# Patient Record
Sex: Female | Born: 1960 | Race: White | Hispanic: No | Marital: Married | Smoking: Never smoker
Health system: Southern US, Community
[De-identification: ages and names within clinical notes are randomized; demographics above are authoritative.]

## PROBLEM LIST (undated history)

## (undated) DIAGNOSIS — F32A Depression, unspecified: Secondary | ICD-10-CM

## (undated) DIAGNOSIS — K219 Gastro-esophageal reflux disease without esophagitis: Secondary | ICD-10-CM

## (undated) DIAGNOSIS — F329 Major depressive disorder, single episode, unspecified: Secondary | ICD-10-CM

## (undated) DIAGNOSIS — N201 Calculus of ureter: Secondary | ICD-10-CM

## (undated) DIAGNOSIS — Z973 Presence of spectacles and contact lenses: Secondary | ICD-10-CM

## (undated) DIAGNOSIS — E039 Hypothyroidism, unspecified: Secondary | ICD-10-CM

## (undated) DIAGNOSIS — Z87442 Personal history of urinary calculi: Secondary | ICD-10-CM

## (undated) DIAGNOSIS — E079 Disorder of thyroid, unspecified: Secondary | ICD-10-CM

## (undated) HISTORY — PX: BREAST BIOPSY: SHX20

## (undated) HISTORY — PX: BREAST EXCISIONAL BIOPSY: SUR124

---

## 1998-08-03 HISTORY — PX: BREAST EXCISIONAL BIOPSY: SUR124

## 2001-11-15 ENCOUNTER — Ambulatory Visit (HOSPITAL_COMMUNITY): Admission: RE | Admit: 2001-11-15 | Discharge: 2001-11-15 | Payer: Self-pay | Admitting: *Deleted

## 2001-11-15 ENCOUNTER — Encounter: Payer: Self-pay | Admitting: *Deleted

## 2001-11-22 ENCOUNTER — Encounter: Payer: Self-pay | Admitting: *Deleted

## 2001-11-22 ENCOUNTER — Ambulatory Visit (HOSPITAL_COMMUNITY): Admission: RE | Admit: 2001-11-22 | Discharge: 2001-11-22 | Payer: Self-pay | Admitting: *Deleted

## 2001-12-21 ENCOUNTER — Ambulatory Visit (HOSPITAL_COMMUNITY): Admission: RE | Admit: 2001-12-21 | Discharge: 2001-12-21 | Payer: Self-pay | Admitting: Obstetrics and Gynecology

## 2001-12-21 ENCOUNTER — Encounter: Payer: Self-pay | Admitting: Obstetrics and Gynecology

## 2002-04-21 ENCOUNTER — Inpatient Hospital Stay (HOSPITAL_COMMUNITY): Admission: AD | Admit: 2002-04-21 | Discharge: 2002-04-24 | Payer: Self-pay | Admitting: Obstetrics and Gynecology

## 2002-04-21 ENCOUNTER — Encounter (INDEPENDENT_AMBULATORY_CARE_PROVIDER_SITE_OTHER): Payer: Self-pay | Admitting: Specialist

## 2003-01-19 ENCOUNTER — Ambulatory Visit (HOSPITAL_COMMUNITY): Admission: RE | Admit: 2003-01-19 | Discharge: 2003-01-19 | Payer: Self-pay | Admitting: Internal Medicine

## 2008-04-26 ENCOUNTER — Ambulatory Visit (HOSPITAL_COMMUNITY): Admission: RE | Admit: 2008-04-26 | Discharge: 2008-04-26 | Payer: Self-pay | Admitting: Urology

## 2010-08-03 DIAGNOSIS — Z8719 Personal history of other diseases of the digestive system: Secondary | ICD-10-CM

## 2010-08-03 HISTORY — DX: Personal history of other diseases of the digestive system: Z87.19

## 2010-12-19 NOTE — Op Note (Signed)
NAMETAJANAY, Caitlyn [name]                         ACCOUNT NO.:  [id number]   MEDICAL RECORD NO.:  [id number]                   PATIENT TYPE:  INP   LOCATION:  9198                                 FACILITY:  [facility]   PHYSICIAN:  Malachi Pro. Ambrose Mantle, M.D.              DATE OF BIRTH:  06-07-1961   DATE OF PROCEDURE:  04/21/2002  DATE OF DISCHARGE:                                 OPERATIVE REPORT   PREOPERATIVE DIAGNOSES:  1. Intrauterine pregnancy at 38 weeks and 5 days.  2. Two previous cesarean sections.  3. Voluntary sterilization.   POSTOPERATIVE DIAGNOSES:  1. Intrauterine pregnancy at 38 weeks and 5 days.  2. Two previous cesarean sections.  3. Voluntary sterilization.   OPERATIONS:  1. Low transverse cervical cesarean section.  2. Bilateral tubal ligation.   SURGEON:  Malachi Pro. Ambrose Mantle, M.D.   ASSISTANT:  Zenaida Niece, M.D.   ANESTHESIA:  Spinal.   DESCRIPTION OF PROCEDURE:  The patient was brought to the operating room and  given a spinal anesthetic by Belva Agee, M.D.  She was placed in the  left lateral tilt position.  The abdomen was prepped with Betadine solution.  A Foley catheter was inserted to straight drain after the urethra was  prepped with Betadine solution.  Fetal heart tones had been confirmed to be  normal prior to the prepping of the abdomen.  The abdomen was draped as a  sterile field.  A transverse incision was made above the two previous  transverse incisions and carried in layers through the skin and subcutaneous  tissue and down to the fascia.  The fascia was incised transversely,  separated from the rectus muscles superiorly and inferiorly, and then the  rectus muscle was already somewhat split but opened more and the peritoneum  was identified.  The peritoneum was entered.  The incision was enlarged.  The lower uterine segment was exposed.  The peritoneum over the lower  uterine segment was incised and extended laterally and then the bladder  was  pulled inferiorly.  An incision was  then made into the lower uterine  segment.  An amniotomy was done.  The incision was extended laterally.  The  vertex was under the incision.  With fundal pressure we delivered the  vertex.  The nose and pharynx were suctioned with the bulb.  The remainder  of the baby was delivered.  The cord was clamped and the infant was given to  Dr. Dorene Grebe who was in attendance.  Apgars were 7 and 9 at one and five  minutes.  It was a female infant, 8 pounds 1 ounce.  The placenta was  removed.  The cervix was dilated.  The uterus was closed with one layer  using a running suture of 0 Vicryl locking suture.  Several other figure-of-  eight sutures were required for complete hemostasis.  Both tubes and ovaries  appeared  normal.  There was window created in the mesosalpinx bilaterally  with the electrical current.  Two ties of 0 plain catgut were placed  proximally and distally over a portion of the tube and then the intervening  portion of tube was excised.  No bleeding occurred.  Reinspection of the  uterine incision revealed a couple of areas of bleeding that were controlled  with the Bovie and with suture ligature.  At this point, the incision  appeared completely dry.  Liberal irrigation confirmed hemostasis.  We tried  to remove all debris from the abdominal cavity then closed the rectus muscle  with interrupted sutures of 0 Vicryl.  Either the parietal nor the visceral  were reapproximated.  The fascia was closed with two running sutures of 0  Vicryl, subcutaneous with a running 3-0 Vicryl, and the skin was closed with  automatic staples.  The patient seemed to tolerate the procedure well.  Estimated blood loss was about 1000 cc.  Sponge and needle counts were  correct and she was returned to recovery in satisfactory condition.                                                Malachi Pro. Ambrose Mantle, M.D.    TFH/MEDQ  D:  04/21/2002  T:  04/22/2002  Job:   [id number]

## 2010-12-19 NOTE — H&P (Signed)
[name], [name]                         ACCOUNT NO.:  [id number]   MEDICAL RECORD NO.:  [id number]                   PATIENT TYPE:  INP   LOCATION:  NA                                   [facility]  [facility]   PHYSICIAN:  Malachi Pro. Ambrose Mantle, M.D.              DATE OF BIRTH:  30-Aug-1960   DATE OF ADMISSION:  04/21/2002  DATE OF DISCHARGE:                                HISTORY & PHYSICAL   PRESENT ILLNESS:  A 50 year old white married female para 2-0-0-2 gravida 3;  last menstrual period July 22, 2002; [facility] April 29, 2002 by dates and  April 30, 2002 by ultrasound; admitted for repeat C section after two  previous C sections.  Blood group and type O positive with a negative  antibody.  Nonreactive serology.  Rubella equivocal.  Hepatitis B surface  antigen negative.  HIV declined.  GC and chlamydia negative.  One-hour  Glucola 92.  Group B strep negative.  Cystic fibrosis screen negative.  Triple screen results not available at this time.  The patient had a vaginal  ultrasound on September 23, 2001:  Crown-rump length 2.09 cm, eight weeks  five days, [facility] April 30, 2002.  She had a follow-up ultrasound on November 15, 2001 that showed a complete placenta previa and also an echogenic  intracardiac focus in the left ventricle.  She saw Dr. Gavin Potters in  consultation on November 16, 2001 considering amniocentesis, but did not have  the amnio.  A follow-up ultrasound on Dec 21, 2001 showed resolution of the  placenta previa.  The patient has had a relatively benign prenatal course.  She has been bothered by itching; bile acids were normal.  She is admitted  now for repeat C section.   ALLERGIES:  PENCILLIN causes rash and itching.   PAST MEDICAL HISTORY:  She has had a history of depression, treated with  Prozac 20 mg q.d.   OPERATIONS:  C sections in 1985 and 1992.  The babies weighed 9 pounds 12  ounces and 11 pounds.  Both were done without complications.  In 2001 she  had a  left breast biopsy.   FAMILY HISTORY:  Father with heart disease and myocardial infarction,  chronic hypertension.  Maternal grandmother had melanoma.  Father had stroke  and degenerative disease of the spine with arthritis.  Maternal uncle had  diabetes.   ALCOHOL, TOBACCO, AND DRUGS:  None.   OBSTETRICAL/GYNECOLOGICAL HISTORY:  In April 1985 and October 1992 the  patient delivered 9 pound 12 ounce and 11 pound males, respectively, by C  section.  She did have a LEEP procedure in 1995.  Pap smears have been  normal since that time.   PHYSICAL EXAMINATION:  GENERAL:  Well-developed, well-nourished white female  in no acute distress.  VITAL SIGNS:  Blood pressure 132/76, pulse 80, weight 196.5 pounds which  represents a 30-pound weight gain during the pregnancy.  HEENT:  Normal.  NECK:  Supple without thyromegaly.  HEART:  Normal size and sounds, no murmurs.  LUNGS:  Clear to P&A.  BREASTS:  Not examined on admission.  ABDOMEN:  Soft.  Fundal height 43 cm.  Fetal heart tones normal.  PELVIC:  Cervix closed.  Presenting part high, but by abdominal exam  presenting part is vertex.    ADMITTING IMPRESSION:  1. Intrauterine pregnancy at 38 weeks five days.  2. Prior cesarean section x2.  3. History of macrosomic infants.   PLAN:  The patient is admitted for C section.  She wants tubal ligation; she  will be asked about this again at the time of the surgery.  She understands  the procedure to be done and is ready to proceed.                                               Malachi Pro. Ambrose Mantle, M.D.    TFH/MEDQ  D:  04/20/2002  T:  04/21/2002  Job:  [id number]

## 2010-12-19 NOTE — Op Note (Signed)
NAME:  Caitlyn [name], Caitlyn [name]                         ACCOUNT NO.:  [id number]   MEDICAL RECORD NO.:  [id number]                   PATIENT TYPE:  AMB   LOCATION:  DAY                                  FACILITY:  [facility]   PHYSICIAN:  Lionel December, M.D.                 DATE OF BIRTH:  10-05-1960   DATE OF PROCEDURE:  DATE OF DISCHARGE:                                 OPERATIVE REPORT   PROCEDURE:  Esophagogastroduodenoscopy.   ENDOSCOPIST:  Lionel December, M.D.   INDICATIONS:  Peter is a 50 year old Caucasian female who has epigastric  pain and not responding well to Aciphex.  She was recently seen by [place] for  mildly elevated transaminases felt to be due to steatohepatitis.  Workup has  been negative.  Ultrasound was repeated last week and it is negative for  cholelithiasis, but this time it does show a fatty change in the liver.  She  is undergoing diagnostic EGD.  The procedure and risks were reviewed with  the patient and informed consent was obtained.   PREOPERATIVE MEDICATIONS:  Cetacaine spray for oropharyngeal topical  anesthesia, Demerol 50 mg IV and Versed 5 mg IV in divided dose.   INSTRUMENT:  Olympus video system.   FINDINGS:  Procedure performed in endoscopy suite.  The patient's vital  signs and O2 saturation were monitored during the procedure and remained  stable.  The patient was placed in the left lateral recumbent position and  endoscope was passed via the oropharynx without any difficulty into the  esophagus.   ESOPHAGUS:  Mucosa of the esophagus was normal.  Squamocolumnar junction was  wavy or serrated with 1 short tongue of gastric-type mucosa but not enough  to be labeled Barrett's.  She had erythema at the GE junction on the gastric  side with edema.  A picture was taken for the record.  There was a small  sliding hiatal hernia no more than 3 cm in length.   STOMACH:  It was empty and distended very well with insufflation.  The folds  of the proximal stomach  were normal.  Examination of the mucosa revealed  many streaks of erythematous mucosa at antrum and prepyloric erosion with  mucosal edema.  Pyloric channel was patent.  Angularis, fundus, and cardia  were normal.   DUODENUM:  Examination of the bulb revealed normal mucosa.  The scope was  passed into the second part of the duodenum where mucosa and folds were  normal.   Endoscope was withdrawn.  The patient tolerated the procedure well.   FINAL DIAGNOSES:  1. Mild changes of reflux esophagitis limited to the gastroesophageal     junction along with a small sliding hiatal hernia.  2. Erosive antral gastritis.   I suspect her epigastric pain may be due to GERD with atypical presentation.   RECOMMENDATIONS:  1. She will continue antireflux measures and Aciphex at present dose.  Prescription given for 1 month at 5 refills.  2. H. pylori serology will be checked today.                                               Lionel December, M.D.    NR/MEDQ  D:  01/19/2003  T:  01/19/2003  Job:  [id number]   cc:   Wyvonnia Lora  [address]  [place]  [place] [address]  Fax: [phone]

## 2010-12-19 NOTE — Discharge Summary (Signed)
Caitlyn [name], LAHMAN                         ACCOUNT NO.:  [id number]   MEDICAL RECORD NO.:  [id number]                   PATIENT TYPE:  INP   LOCATION:  9120                                 FACILITY:  [facility]   PHYSICIAN:  Malachi Pro. Ambrose Mantle, M.D.              DATE OF BIRTH:  [birth year]   DATE OF ADMISSION:  04/21/2002  DATE OF DISCHARGE:  04/24/2002                                 DISCHARGE SUMMARY   HISTORY:  This is a 50 year old white female with two previous C-sections  admitted for repeat C-section and tubal ligation.  Blood group and type O  positive with a negative antibody. RPR nonreactive. Rubella equivocal.  Hepatitis B surface antigen negative. HIV declined.  GC and Chlamydia  negative. One hour Glucola 92. Group B streptococcus negative.  The patient  underwent a low transverse cervical C-section and bilateral tubal ligation  by Dr. Ambrose Mantle with Dr. Jackelyn Knife assisting under spinal anesthesia with  delivery of a female infant 8 pounds 1 ounce, Apgars of 7 at 1 and 9 at 5  minutes.  Postpartum the patient did quite well.  She had a good return of  bowel and urinary function.  She is passing flatus freely.  Had not had a  bowel movement.  Her abdomen was soft and nontender.  Staples removed.  Strips were applied and she was ready for discharge on the third postop day.   LABORATORY DATA:  Initial hemoglobin 9.7.  Hematocrit 29.5.  White count  10,600.  Platelet count 230,000.  Follow up hemoglobin 9.1.  Hematocrit  27.3.  Comprehensive metabolic profile on admission was normal except for an  AST of 70 and ALT of 47. These were repeated on the 20th of September and  there was 76 AST and 44 ALT.  The patient is presently undergoing a  hepatitis panel.  The hepatitis B surface antigen was negative.  The  hepatitis core antibody to the hepatitic C is negative.  The antibody  HAB/IgM and hepatitic B core IgM are pending.  Urinalysis was negative. RPR  nonreactive.  Rubella was  repeated the hospital and it was 12.3. Immune is  greater than 10.  \   FINAL DIAGNOSES:  1. Intrauterine pregnancy at 39 weeks.  2. See prior C-sections.  3. Voluntary sterilization.   OPERATION:  1. Low transverse cervical C-section.  2. Bilateral tubal ligation.   FINAL CONDITION:  Improved.   INSTRUCTIONS:  Instructions include regular discharge instruction booklet.  Percocet 5/325 20 tablets one every four to six hours as needed for pain.  She is to return to the office in 10 to 14 days for follow up examination.  At that time we will go over the hepatitis profile and if regardless of the  profile results we will notify her medical doctor.  She is to continue her  Prozac as taken prior to delivery.  Malachi Pro. Ambrose Mantle, M.D.    TFH/MEDQ  D:  04/24/2002  T:  04/25/2002  Job:  [id number]

## 2013-02-18 ENCOUNTER — Emergency Department (HOSPITAL_COMMUNITY)
Admission: EM | Admit: 2013-02-18 | Discharge: 2013-02-18 | Disposition: A | Discharge status: Home / Self Care | Payer: BC Managed Care – PPO | Attending: Emergency Medicine | Admitting: Emergency Medicine

## 2013-02-18 ENCOUNTER — Encounter (HOSPITAL_COMMUNITY): Payer: Self-pay | Admitting: *Deleted

## 2013-02-18 ENCOUNTER — Emergency Department (HOSPITAL_COMMUNITY): Payer: BC Managed Care – PPO

## 2013-02-18 DIAGNOSIS — M79609 Pain in unspecified limb: Secondary | ICD-10-CM | POA: Insufficient documentation

## 2013-02-18 DIAGNOSIS — Z8719 Personal history of other diseases of the digestive system: Secondary | ICD-10-CM | POA: Insufficient documentation

## 2013-02-18 DIAGNOSIS — R071 Chest pain on breathing: Secondary | ICD-10-CM | POA: Insufficient documentation

## 2013-02-18 DIAGNOSIS — E079 Disorder of thyroid, unspecified: Secondary | ICD-10-CM | POA: Insufficient documentation

## 2013-02-18 DIAGNOSIS — F3289 Other specified depressive episodes: Secondary | ICD-10-CM | POA: Insufficient documentation

## 2013-02-18 DIAGNOSIS — R0789 Other chest pain: Secondary | ICD-10-CM

## 2013-02-18 DIAGNOSIS — Z79899 Other long term (current) drug therapy: Secondary | ICD-10-CM | POA: Insufficient documentation

## 2013-02-18 DIAGNOSIS — Z880 Allergy status to penicillin: Secondary | ICD-10-CM | POA: Insufficient documentation

## 2013-02-18 DIAGNOSIS — M25519 Pain in unspecified shoulder: Secondary | ICD-10-CM | POA: Insufficient documentation

## 2013-02-18 DIAGNOSIS — F329 Major depressive disorder, single episode, unspecified: Secondary | ICD-10-CM | POA: Insufficient documentation

## 2013-02-18 HISTORY — DX: Major depressive disorder, single episode, unspecified: F32.9

## 2013-02-18 HISTORY — DX: Disorder of thyroid, unspecified: E07.9

## 2013-02-18 HISTORY — DX: Gastro-esophageal reflux disease without esophagitis: K21.9

## 2013-02-18 HISTORY — DX: Depression, unspecified: F32.A

## 2013-02-18 LAB — LIPASE, BLOOD: Lipase: 49 U/L (ref 11–59)

## 2013-02-18 LAB — CBC WITH DIFFERENTIAL/PLATELET
Eosinophils Absolute: 0 10*3/uL (ref 0.0–0.7)
Eosinophils Relative: 0 % (ref 0–5)
HCT: 40.1 % (ref 36.0–46.0)
Lymphocytes Relative: 27 % (ref 12–46)
Lymphs Abs: 1.4 10*3/uL (ref 0.7–4.0)
MCH: 29.2 pg (ref 26.0–34.0)
MCV: 85.5 fL (ref 78.0–100.0)
Monocytes Absolute: 0.6 10*3/uL (ref 0.1–1.0)
Monocytes Relative: 11 % (ref 3–12)
RBC: 4.69 MIL/uL (ref 3.87–5.11)
WBC: 5.3 10*3/uL (ref 4.0–10.5)

## 2013-02-18 LAB — BASIC METABOLIC PANEL WITH GFR
BUN: 13 mg/dL (ref 6–23)
CO2: 28 meq/L (ref 19–32)
Calcium: 10 mg/dL (ref 8.4–10.5)
Chloride: 102 meq/L (ref 96–112)
Creatinine, Ser: 0.84 mg/dL (ref 0.50–1.10)
GFR calc Af Amer: >90 mL/min
GFR calc non Af Amer: 80 mL/min — ABNORMAL LOW
Glucose, Bld: 113 mg/dL — ABNORMAL HIGH (ref 70–99)
Potassium: 3.8 meq/L (ref 3.5–5.1)
Sodium: 140 meq/L (ref 135–145)

## 2013-02-18 LAB — HEPATIC FUNCTION PANEL
ALT: 48 U/L — ABNORMAL HIGH (ref 0–35)
AST: 46 U/L — ABNORMAL HIGH (ref 0–37)
Albumin: 4.2 g/dL (ref 3.5–5.2)
Alkaline Phosphatase: 64 U/L (ref 39–117)
Bilirubin, Direct: 0.2 mg/dL (ref 0.0–0.3)
Indirect Bilirubin: 0.4 mg/dL (ref 0.3–0.9)
Total Bilirubin: 0.6 mg/dL (ref 0.3–1.2)
Total Protein: 7.3 g/dL (ref 6.0–8.3)

## 2013-02-18 MED ORDER — PANTOPRAZOLE SODIUM 40 MG IV SOLR
40.0000 mg | Freq: Once | INTRAVENOUS | Status: AC
  Administered 2013-02-18: 40 mg via INTRAVENOUS
  Filled 2013-02-18: qty 40

## 2013-02-18 MED ORDER — HYDROCODONE-ACETAMINOPHEN 5-325 MG PO TABS: ORAL_TABLET | ORAL | Status: DC

## 2013-02-18 MED ORDER — MORPHINE SULFATE 4 MG/ML IJ SOLN
4.0000 mg | INTRAMUSCULAR | Status: DC | PRN
  Administered 2013-02-18: 4 mg via INTRAVENOUS
  Filled 2013-02-18: qty 1

## 2013-02-18 MED ORDER — FAMOTIDINE IN NACL 20-0.9 MG/50ML-% IV SOLN
20.0000 mg | Freq: Once | INTRAVENOUS | Status: AC
  Administered 2013-02-18: 20 mg via INTRAVENOUS
  Filled 2013-02-18: qty 50

## 2013-02-18 MED ORDER — DIAZEPAM 5 MG PO TABS: 5.0000 mg | ORAL_TABLET | Freq: Two times a day (BID) | ORAL | Status: DC | PRN

## 2013-02-18 MED ORDER — GI COCKTAIL ~~LOC~~
30.0000 mL | Freq: Once | ORAL | Status: AC
  Administered 2013-02-18: 30 mL via ORAL
  Filled 2013-02-18: qty 30

## 2013-02-18 MED ORDER — DIAZEPAM 5 MG PO TABS
5.0000 mg | ORAL_TABLET | Freq: Once | ORAL | Status: AC
  Administered 2013-02-18: 5 mg via ORAL
  Filled 2013-02-18: qty 1

## 2013-02-18 NOTE — ED Provider Notes (Signed)
History    CSN: [id number] Arrival date & time 02/18/13  1716  First MD Initiated Contact with Patient 02/18/13 1751     Chief Complaint  Patient presents with  . Chest Pain  . Shoulder Pain  . Arm Pain  . Nausea    HPI Pt was seen at 1810. Per pt and her family, c/o gradual onset and persistence of constant mid-sternal chest "pain" since last night approx 2200. Pt states the discomfort has been constant since that time. Pt describes the discomfort as "dull" and "pressure," worse when she takes a deep breath, radiates into her mid-back.  Pt states she experienced the same symptoms 5 days ago that lasted for 1 full day before resolving after taking OTC zantac. Pt states she was evaluated by [facility], [facility], [facility], given a GI cocktail and a SL ntg with transient relief. Pt came to the ED for further eval. Denies palpitations, no SOB/cough, no abd pain, no N/V/D, no fevers, no rash.    Past Medical History  Diagnosis Date  . Depression   . Thyroid disease   . GERD (gastroesophageal reflux disease)    Past Surgical History  Procedure Laterality Date  . Cesarean section    . Breast biopsy      FHx: father with MI in his 35's.  History  Substance Use Topics  . Smoking status: Never Smoker   . Smokeless tobacco: Not on file  . Alcohol Use: No    Review of Systems ROS: Statement: All systems negative except as marked or noted in the HPI; Constitutional: Negative for fever and chills. ; ; Eyes: Negative for eye pain, redness and discharge. ; ; ENMT: Negative for ear pain, hoarseness, nasal congestion, sinus pressure and sore throat. ; ; Cardiovascular: +CP. Negative for palpitations, diaphoresis, dyspnea and peripheral edema. ; ; Respiratory: Negative for cough, wheezing and stridor. ; ; Gastrointestinal: Negative for nausea, vomiting, diarrhea, abdominal pain, blood in stool, hematemesis, jaundice and rectal bleeding. . ; ; Genitourinary: Negative for dysuria, flank pain and hematuria. ; ;  Musculoskeletal: Negative for back pain and neck pain. Negative for swelling and trauma.; ; Skin: Negative for pruritus, rash, abrasions, blisters, bruising and skin lesion.; ; Neuro: Negative for headache, lightheadedness and neck stiffness. Negative for weakness, altered level of consciousness , altered mental status, extremity weakness, paresthesias, involuntary movement, seizure and syncope.       Allergies  Penicillins  Home Medications   Current Outpatient Rx  Name  Route  Sig  Dispense  Refill  . buPROPion (WELLBUTRIN XL) 150 MG 24 hr tablet   Oral   Take 150 mg by mouth daily.         Marland Kitchen levothyroxine (SYNTHROID, LEVOTHROID) 25 MCG tablet   Oral   Take 25 mcg by mouth daily before breakfast.         . HYDROcodone-acetaminophen (NORCO/VICODIN) 5-325 MG per tablet      1 or 2 tabs PO q6 hours prn pain   12 tablet   0    BP 122/63  Pulse 50  Resp 13  SpO2 97% Physical Exam 1815: Physical examination:  Nursing notes reviewed; Vital signs and O2 SAT reviewed;  Constitutional: Well developed, Well nourished, Well hydrated, In no acute distress; Head:  Normocephalic, atraumatic; Eyes: EOMI, PERRL, No scleral icterus; ENMT: Mouth and pharynx normal, Mucous membranes moist; Neck: Supple, Full range of motion, No lymphadenopathy; Cardiovascular: Regular rate and rhythm, No murmur, rub, or gallop; Respiratory: Breath sounds clear &  equal bilaterally, No rales, rhonchi, wheezes.  Speaking full sentences with ease, Normal respiratory effort/excursion; Chest: +mild tenderness to palp bilat parasternal areas. No rash, no soft tissue crepitus.  Movement normal; Abdomen: Soft, Nontender, Nondistended, Normal bowel sounds; Genitourinary: No CVA tenderness; Extremities: Pulses normal, No tenderness, No edema, No calf edema or asymmetry.; Neuro: AA&Ox3, Major CN grossly intact.  Speech clear. No gross focal motor or sensory deficits in extremities.; Skin: Color normal, Warm, Dry.  ED Course   Procedures     MDM  MDM Reviewed: previous chart, nursing note and vitals Interpretation: labs, ECG and x-ray    Date: 02/18/2013  Rate: 67  Rhythm: normal sinus rhythm  QRS Axis: normal  Intervals: normal  ST/T Wave abnormalities: normal  Conduction Disutrbances:none  Narrative Interpretation:   Old EKG Reviewed: none available  Results for orders placed during the hospital encounter of 02/18/13  TROPONIN I      Result Value Range   Troponin I <0.30  <0.30 ng/mL  CBC WITH DIFFERENTIAL      Result Value Range   WBC 5.3  4.0 - 10.5 K/uL   RBC 4.69  3.87 - 5.11 MIL/uL   Hemoglobin 13.7  12.0 - 15.0 g/dL   HCT 16.1  09.6 - 04.5 %   MCV 85.5  78.0 - 100.0 fL   MCH 29.2  26.0 - 34.0 pg   MCHC 34.2  30.0 - 36.0 g/dL   RDW 40.9  81.1 - 91.4 %   Platelets 210  150 - 400 K/uL   Neutrophils Relative % 62  43 - 77 %   Neutro Abs 3.3  1.7 - 7.7 K/uL   Lymphocytes Relative 27  12 - 46 %   Lymphs Abs 1.4  0.7 - 4.0 K/uL   Monocytes Relative 11  3 - 12 %   Monocytes Absolute 0.6  0.1 - 1.0 K/uL   Eosinophils Relative 0  0 - 5 %   Eosinophils Absolute 0.0  0.0 - 0.7 K/uL   Basophils Relative 0  0 - 1 %   Basophils Absolute 0.0  0.0 - 0.1 K/uL  BASIC METABOLIC PANEL      Result Value Range   Sodium 140  135 - 145 mEq/L   Potassium 3.8  3.5 - 5.1 mEq/L   Chloride 102  96 - 112 mEq/L   CO2 28  19 - 32 mEq/L   Glucose, Bld 113 (*) 70 - 99 mg/dL   BUN 13  6 - 23 mg/dL   Creatinine, Ser 7.82  0.50 - 1.10 mg/dL   Calcium 95.6  8.4 - 21.3 mg/dL   GFR calc non Af Amer 80 (*) >90 mL/min   GFR calc Af Amer >90  >90 mL/min  HEPATIC FUNCTION PANEL      Result Value Range   Total Protein 7.3  6.0 - 8.3 g/dL   Albumin 4.2  3.5 - 5.2 g/dL   AST 46 (*) 0 - 37 U/L   ALT 48 (*) 0 - 35 U/L   Alkaline Phosphatase 64  39 - 117 U/L   Total Bilirubin 0.6  0.3 - 1.2 mg/dL   Bilirubin, Direct 0.2  0.0 - 0.3 mg/dL   Indirect Bilirubin 0.4  0.3 - 0.9 mg/dL  LIPASE, BLOOD      Result Value  Range   Lipase 49  11 - 59 U/L  D-DIMER, QUANTITATIVE      Result Value Range   D-Dimer, Quant <0.27  0.00 -  0.48 ug/mL-FEU   Dg Chest Portable 1 View 02/18/2013   *RADIOLOGY REPORT*  Clinical Data: Chest pain and shoulder pain  PORTABLE CHEST - 1 VIEW  Comparison: None  Findings: The heart size and mediastinal contours are within normal limits.  Both lungs are clear.  The visualized skeletal structures are unremarkable.  IMPRESSION: Negative exam.   Original Report Authenticated By: Signa Kell, M.D.    2115:  Pt states she feels better after the valium. She would like to go home now. Doubt PE as cause for symptoms with normal d-dimer and low risk Wells.  Doubt ACS as cause for symptoms with normal troponin and EKG without acute STTW changes after 18+ hours of constant symptoms. TIMI 0. Will tx symptomatically at this time. Pt agrees to f/u with PMD Monday as well as Cards MD this week to obtain stress test given her FHx. Dx and testing d/w pt and family.  Questions answered.  Verb understanding, agreeable to d/c home with outpt f/u.     Laray Anger, DO 02/21/13 2229

## 2013-02-18 NOTE — ED Notes (Signed)
Pt c/o intermittent chest pain that has been going on since last week. Pt c/o centralized chest pain that radiates to her neck and down her left arm. Pt states she feels like an elephant is sitting on her chest (dull, pressure). Rates pain a 5. Pt was seen at an urgent care today and was given a gi cocktail with no relief. Pt was given a nitro tablet with some relief then the pain came back. Pt was told to come to the ED.

## 2013-02-18 NOTE — ED Notes (Signed)
Crying, "I don't know why, I'm just an emotional basketcase"

## 2013-12-18 ENCOUNTER — Ambulatory Visit: Payer: BC Managed Care – PPO | Attending: Podiatry | Admitting: Physical Therapy

## 2013-12-18 DIAGNOSIS — R5381 Other malaise: Secondary | ICD-10-CM | POA: Diagnosis not present

## 2013-12-18 DIAGNOSIS — M25676 Stiffness of unspecified foot, not elsewhere classified: Secondary | ICD-10-CM | POA: Insufficient documentation

## 2013-12-18 DIAGNOSIS — M25579 Pain in unspecified ankle and joints of unspecified foot: Secondary | ICD-10-CM | POA: Insufficient documentation

## 2013-12-18 DIAGNOSIS — IMO0001 Reserved for inherently not codable concepts without codable children: Secondary | ICD-10-CM | POA: Insufficient documentation

## 2013-12-18 DIAGNOSIS — M25673 Stiffness of unspecified ankle, not elsewhere classified: Secondary | ICD-10-CM | POA: Insufficient documentation

## 2013-12-21 ENCOUNTER — Ambulatory Visit: Payer: BC Managed Care – PPO | Admitting: Physical Therapy

## 2013-12-21 DIAGNOSIS — IMO0001 Reserved for inherently not codable concepts without codable children: Secondary | ICD-10-CM | POA: Diagnosis not present

## 2013-12-26 ENCOUNTER — Ambulatory Visit: Payer: BC Managed Care – PPO | Admitting: Physical Therapy

## 2013-12-26 DIAGNOSIS — IMO0001 Reserved for inherently not codable concepts without codable children: Secondary | ICD-10-CM | POA: Diagnosis not present

## 2013-12-28 ENCOUNTER — Encounter: Payer: BC Managed Care – PPO | Admitting: Physical Therapy

## 2014-01-02 ENCOUNTER — Ambulatory Visit: Payer: BC Managed Care – PPO | Attending: Podiatry | Admitting: Physical Therapy

## 2014-01-02 DIAGNOSIS — IMO0001 Reserved for inherently not codable concepts without codable children: Secondary | ICD-10-CM | POA: Insufficient documentation

## 2014-01-02 DIAGNOSIS — M25579 Pain in unspecified ankle and joints of unspecified foot: Secondary | ICD-10-CM | POA: Insufficient documentation

## 2014-01-02 DIAGNOSIS — R5381 Other malaise: Secondary | ICD-10-CM | POA: Diagnosis not present

## 2014-01-02 DIAGNOSIS — M25673 Stiffness of unspecified ankle, not elsewhere classified: Secondary | ICD-10-CM | POA: Insufficient documentation

## 2014-01-02 DIAGNOSIS — M25676 Stiffness of unspecified foot, not elsewhere classified: Secondary | ICD-10-CM | POA: Insufficient documentation

## 2014-01-04 ENCOUNTER — Encounter: Payer: BC Managed Care – PPO | Admitting: Physical Therapy

## 2014-01-09 ENCOUNTER — Ambulatory Visit: Payer: BC Managed Care – PPO | Admitting: Physical Therapy

## 2014-01-09 DIAGNOSIS — IMO0001 Reserved for inherently not codable concepts without codable children: Secondary | ICD-10-CM | POA: Diagnosis not present

## 2014-01-11 ENCOUNTER — Ambulatory Visit: Payer: BC Managed Care – PPO | Admitting: Physical Therapy

## 2014-01-11 DIAGNOSIS — IMO0001 Reserved for inherently not codable concepts without codable children: Secondary | ICD-10-CM | POA: Diagnosis not present

## 2014-01-16 ENCOUNTER — Ambulatory Visit: Payer: BC Managed Care – PPO | Admitting: Physical Therapy

## 2014-01-16 DIAGNOSIS — IMO0001 Reserved for inherently not codable concepts without codable children: Secondary | ICD-10-CM | POA: Diagnosis not present

## 2015-02-16 ENCOUNTER — Emergency Department (HOSPITAL_COMMUNITY): Payer: BC Managed Care – PPO

## 2015-02-16 ENCOUNTER — Encounter (HOSPITAL_COMMUNITY): Payer: Self-pay | Admitting: Emergency Medicine

## 2015-02-16 ENCOUNTER — Emergency Department (HOSPITAL_COMMUNITY)
Admission: EM | Admit: 2015-02-16 | Discharge: 2015-02-16 | Disposition: A | Discharge status: Home / Self Care | Payer: BC Managed Care – PPO | Attending: Emergency Medicine | Admitting: Emergency Medicine

## 2015-02-16 DIAGNOSIS — Z8719 Personal history of other diseases of the digestive system: Secondary | ICD-10-CM | POA: Insufficient documentation

## 2015-02-16 DIAGNOSIS — Z79899 Other long term (current) drug therapy: Secondary | ICD-10-CM | POA: Diagnosis not present

## 2015-02-16 DIAGNOSIS — R109 Unspecified abdominal pain: Secondary | ICD-10-CM | POA: Diagnosis present

## 2015-02-16 DIAGNOSIS — E079 Disorder of thyroid, unspecified: Secondary | ICD-10-CM | POA: Insufficient documentation

## 2015-02-16 DIAGNOSIS — R1032 Left lower quadrant pain: Secondary | ICD-10-CM | POA: Diagnosis not present

## 2015-02-16 DIAGNOSIS — F329 Major depressive disorder, single episode, unspecified: Secondary | ICD-10-CM | POA: Insufficient documentation

## 2015-02-16 DIAGNOSIS — Z880 Allergy status to penicillin: Secondary | ICD-10-CM | POA: Diagnosis not present

## 2015-02-16 LAB — URINALYSIS, ROUTINE W REFLEX MICROSCOPIC
Bilirubin Urine: NEGATIVE
Glucose, UA: NEGATIVE mg/dL
Hgb urine dipstick: NEGATIVE
Ketones, ur: NEGATIVE mg/dL
Nitrite: NEGATIVE
Protein, ur: NEGATIVE mg/dL
Specific Gravity, Urine: 1.01 (ref 1.005–1.030)
Urobilinogen, UA: 0.2 mg/dL (ref 0.0–1.0)
pH: 6 (ref 5.0–8.0)

## 2015-02-16 LAB — CBC WITH DIFFERENTIAL/PLATELET
Basophils Absolute: 0 10*3/uL (ref 0.0–0.1)
Basophils Relative: 0 % (ref 0–1)
Eosinophils Absolute: 0.1 10*3/uL (ref 0.0–0.7)
Eosinophils Relative: 1 % (ref 0–5)
HCT: 41.5 % (ref 36.0–46.0)
Hemoglobin: 14.5 g/dL (ref 12.0–15.0)
Lymphocytes Relative: 24 % (ref 12–46)
Lymphs Abs: 1.7 10*3/uL (ref 0.7–4.0)
MCH: 30.7 pg (ref 26.0–34.0)
MCHC: 34.9 g/dL (ref 30.0–36.0)
MCV: 87.7 fL (ref 78.0–100.0)
Monocytes Absolute: 0.5 10*3/uL (ref 0.1–1.0)
Monocytes Relative: 8 % (ref 3–12)
Neutro Abs: 4.7 10*3/uL (ref 1.7–7.7)
Neutrophils Relative %: 67 % (ref 43–77)
Platelets: 247 10*3/uL (ref 150–400)
RBC: 4.73 MIL/uL (ref 3.87–5.11)
RDW: 12.6 % (ref 11.5–15.5)
WBC: 7 10*3/uL (ref 4.0–10.5)

## 2015-02-16 LAB — BASIC METABOLIC PANEL
Anion gap: 9 (ref 5–15)
BUN: 11 mg/dL (ref 6–20)
CO2: 26 mmol/L (ref 22–32)
Calcium: 9.1 mg/dL (ref 8.9–10.3)
Chloride: 103 mmol/L (ref 101–111)
Creatinine, Ser: 0.81 mg/dL (ref 0.44–1.00)
GFR calc Af Amer: >60 mL/min (ref >60)
GFR calc non Af Amer: >60 mL/min (ref >60)
Glucose, Bld: 110 mg/dL — ABNORMAL HIGH (ref 65–99)
Potassium: 3.2 mmol/L — ABNORMAL LOW (ref 3.5–5.1)
Sodium: 138 mmol/L (ref 135–145)

## 2015-02-16 LAB — URINE MICROSCOPIC-ADD ON

## 2015-02-16 MED ORDER — IOHEXOL 300 MG/ML  SOLN
100.0000 mL | Freq: Once | INTRAMUSCULAR | Status: AC | PRN
  Administered 2015-02-16: 100 mL via INTRAVENOUS

## 2015-02-16 MED ORDER — IOHEXOL 300 MG/ML  SOLN
50.0000 mL | Freq: Once | INTRAMUSCULAR | Status: AC | PRN
  Administered 2015-02-16: 50 mL via ORAL

## 2015-02-16 MED ORDER — TRAMADOL HCL 50 MG PO TABS: 50.0000 mg | ORAL_TABLET | Freq: Four times a day (QID) | ORAL | Status: DC | PRN

## 2015-02-16 NOTE — Discharge Instructions (Signed)
Abdominal Pain, Women °Abdominal (stomach, pelvic, or belly) pain can be caused by many things. It is important to tell your doctor: °· The location of the pain. °· Does it come and go or is it present all the time? °· Are there things that start the pain (eating certain foods, exercise)? °· Are there other symptoms associated with the pain (fever, nausea, vomiting, diarrhea)? °All of this is helpful to know when trying to find the cause of the pain. °CAUSES  °· Stomach: virus or bacteria infection, or ulcer. °· Intestine: appendicitis (inflamed appendix), regional ileitis (Crohn's disease), ulcerative colitis (inflamed colon), irritable bowel syndrome, diverticulitis (inflamed diverticulum of the colon), or cancer of the stomach or intestine. °· Gallbladder disease or stones in the gallbladder. °· Kidney disease, kidney stones, or infection. °· Pancreas infection or cancer. °· Fibromyalgia (pain disorder). °· Diseases of the female organs: °¨ Uterus: fibroid (non-cancerous) tumors or infection. °¨ Fallopian tubes: infection or tubal pregnancy. °¨ Ovary: cysts or tumors. °¨ Pelvic adhesions (scar tissue). °¨ Endometriosis (uterus lining tissue growing in the pelvis and on the pelvic organs). °¨ Pelvic congestion syndrome (female organs filling up with blood just before the menstrual period). °¨ Pain with the menstrual period. °¨ Pain with ovulation (producing an egg). °¨ Pain with an IUD (intrauterine device, birth control) in the uterus. °¨ Cancer of the female organs. °· Functional pain (pain not caused by a disease, may improve without treatment). °· Psychological pain. °· Depression. °DIAGNOSIS  °Your doctor will decide the seriousness of your pain by doing an examination. °· Blood tests. °· X-rays. °· Ultrasound. °· CT scan (computed tomography, special type of X-ray). °· MRI (magnetic resonance imaging). °· Cultures, for infection. °· Barium enema (dye inserted in the large intestine, to better view it with  X-rays). °· Colonoscopy (looking in intestine with a lighted tube). °· Laparoscopy (minor surgery, looking in abdomen with a lighted tube). °· Major abdominal exploratory surgery (looking in abdomen with a large incision). °TREATMENT  °The treatment will depend on the cause of the pain.  °· Many cases can be observed and treated at home. °· Over-the-counter medicines recommended by your caregiver. °· Prescription medicine. °· Antibiotics, for infection. °· Birth control pills, for painful periods or for ovulation pain. °· Hormone treatment, for endometriosis. °· Nerve blocking injections. °· Physical therapy. °· Antidepressants. °· Counseling with a psychologist or psychiatrist. °· Minor or major surgery. °HOME CARE INSTRUCTIONS  °· Do not take laxatives, unless directed by your caregiver. °· Take over-the-counter pain medicine only if ordered by your caregiver. Do not take aspirin because it can cause an upset stomach or bleeding. °· Try a clear liquid diet (broth or water) as ordered by your caregiver. Slowly move to a bland diet, as tolerated, if the pain is related to the stomach or intestine. °· Have a thermometer and take your temperature several times a day, and record it. °· Bed rest and sleep, if it helps the pain. °· Avoid sexual intercourse, if it causes pain. °· Avoid stressful situations. °· Keep your follow-up appointments and tests, as your caregiver orders. °· If the pain does not go away with medicine or surgery, you may try: °¨ Acupuncture. °¨ Relaxation exercises (yoga, meditation). °¨ Group therapy. °¨ Counseling. °SEEK MEDICAL CARE IF:  °· You notice certain foods cause stomach pain. °· Your home care treatment is not helping your pain. °· You need stronger pain medicine. °· You want your IUD removed. °· You feel faint or   lightheaded. °· You develop nausea and vomiting. °· You develop a rash. °· You are having side effects or an allergy to your medicine. °SEEK IMMEDIATE MEDICAL CARE IF:  °· Your  pain does not go away or gets worse. °· You have a fever. °· Your pain is felt only in portions of the abdomen. The right side could possibly be appendicitis. The left lower portion of the abdomen could be colitis or diverticulitis. °· You are passing blood in your stools (bright red or black tarry stools, with or without vomiting). °· You have blood in your urine. °· You develop chills, with or without a fever. °· You pass out. °MAKE SURE YOU:  °· Understand these instructions. °· Will watch your condition. °· Will get help right away if you are not doing well or get worse. °Document Released: 05/17/2007 Document Revised: 12/04/2013 Document Reviewed: 06/06/2009 °[employer]® Patient Information ©2015 [employer]. This information is not intended to replace advice given to you by your health care provider. Make sure you discuss any questions you have with your health care provider. ° °

## 2015-02-16 NOTE — ED Notes (Signed)
Pt c/o lower abd pain x 2-3 months worsening x 1 week. denies n/v/d. Denies vaginal bleeding or d/c.

## 2015-02-16 NOTE — ED Notes (Signed)
Pt given oral contrast to drink. Pt instructed to notify this RN when finished.

## 2015-02-16 NOTE — ED Notes (Signed)
Pt made aware a urine specimen was needed. Pt verbalized understanding. 

## 2015-02-24 NOTE — ED Provider Notes (Signed)
CSN: [id number]     Arrival date & time 02/16/15  1618 History   First MD Initiated Contact with Patient 02/16/15 1620     Chief Complaint  Patient presents with  . Abdominal Pain     (Consider location/radiation/quality/duration/timing/severity/associated sxs/prior Treatment) HPI   54 year old female with abdominal pain. Left lower quadrant. Intermittent for the last 2 months. Progressive and more frequent in the past few days. Feels like the pain. No appreciable exacerbating relieving factors. No fevers or chills. No urinary complaints. No nausea vomiting. No sensory change in her bowel movements. Patient was evaluated at urgent care prior to arrival & referred to the emergency room for CT scan.  Past Medical History  Diagnosis Date  . Depression   . Thyroid disease   . GERD (gastroesophageal reflux disease)    Past Surgical History  Procedure Laterality Date  . Cesarean section    . Breast biopsy     No family history on file. History  Substance Use Topics  . Smoking status: Never Smoker   . Smokeless tobacco: Not on file  . Alcohol Use: No   OB History    No data available     Review of Systems  All systems reviewed and negative, other than as noted in HPI.  Allergies  Penicillins  Home Medications   Prior to Admission medications   Medication Sig Start Date End Date Taking? Authorizing Provider  buPROPion (WELLBUTRIN XL) 150 MG 24 hr tablet Take 150 mg by mouth daily.   Yes Historical Provider, MD  levothyroxine (SYNTHROID, LEVOTHROID) 25 MCG tablet Take 25 mcg by mouth daily before breakfast.   Yes Historical Provider, MD  Multiple Vitamin (MULTIVITAMIN WITH MINERALS) TABS tablet Take 1 tablet by mouth daily.   Yes Historical Provider, MD  diazepam (VALIUM) 5 MG tablet Take 1 tablet (5 mg total) by mouth 2 (two) times daily as needed for anxiety (muscle spasm). 02/18/13   Samuel Jester, DO  HYDROcodone-acetaminophen (NORCO/VICODIN) 5-325 MG per tablet 1 or 2  tabs PO q6 hours prn pain 02/18/13   Samuel Jester, DO  phentermine (ADIPEX-P) 37.5 MG tablet Take 37.5 mg by mouth every 3 (three) days.  11/10/14   Historical Provider, MD  traMADol (ULTRAM) 50 MG tablet Take 1 tablet (50 mg total) by mouth every 6 (six) hours as needed. 02/16/15   Raeford Razor, MD   BP 130/60 mmHg  Pulse 65  Temp(Src) 98.6 F (37 C) (Oral)  Resp 16  Ht  (1.702 m)  Wt 163 lb (73.936 kg)  BMI 25.52 kg/m2  SpO2 100% Physical Exam  Constitutional: She appears well-developed and well-nourished. No distress.  HENT:  Head: Normocephalic and atraumatic.  Eyes: Conjunctivae are normal. Right eye exhibits no discharge. Left eye exhibits no discharge.  Neck: Neck supple.  Cardiovascular: Normal rate, regular rhythm and normal heart sounds.  Exam reveals no gallop and no friction rub.   No murmur heard. Pulmonary/Chest: Effort normal and breath sounds normal. No respiratory distress.  Abdominal: Soft. She exhibits no distension. There is tenderness.  Mild to moderate tenderness in the left lower quadrant without rebound or guarding. No distention. No CVA tenderness.  Musculoskeletal: She exhibits no edema or tenderness.  Neurological: She is alert.  Skin: Skin is warm and dry.  Psychiatric: She has a normal mood and affect. Her behavior is normal. Thought content normal.  Nursing note and vitals reviewed.   ED Course  Procedures (including critical care time) Labs Review Labs Reviewed  URINALYSIS, ROUTINE W REFLEX MICROSCOPIC (NOT AT [facility]) - Abnormal; Notable for the following:    Leukocytes, UA MODERATE (*)    All other components within normal limits  BASIC METABOLIC PANEL - Abnormal; Notable for the following:    Potassium 3.2 (*)    Glucose, Bld 110 (*)    All other components within normal limits  URINE MICROSCOPIC-ADD ON - Abnormal; Notable for the following:    Squamous Epithelial / LPF FEW (*)    Bacteria, UA FEW (*)    All other components within  normal limits  CBC WITH DIFFERENTIAL/PLATELET    Imaging Review No results found.   Ct Abdomen Pelvis W Contrast  02/16/2015   CLINICAL DATA:  Left lower quadrant pain for 2 months  EXAM: CT ABDOMEN AND PELVIS WITH CONTRAST  TECHNIQUE: Multidetector CT imaging of the abdomen and pelvis was performed using the standard protocol following bolus administration of intravenous contrast.  CONTRAST:  OMNIPAQUE IOHEXOL 300 MG/ML SOLN, 50mL OMNIPAQUE IOHEXOL 300 MG/ML SOLN  COMPARISON:  12/21/2008  FINDINGS: Lung bases are unremarkable. Sagittal images of the spine are unremarkable. Enhanced liver, pancreas, spleen and adrenal glands are unremarkable. No calcified gallstones are noted within gallbladder. No aortic aneurysm. Enhanced kidneys are symmetrical in size. No hydronephrosis or hydroureter.  Delayed renal images shows bilateral renal symmetrical excretion. Bilateral visualized proximal ureter is unremarkable. No small bowel obstruction. No pericecal inflammation. Normal appendix. There is moderate stool in right colon and transverse colon.  No ascites or free air.  No adenopathy.  There is no evidence of colitis or diverticulitis. There is redundant sigmoid colon. Moderate gas is noted in mid sigmoid colon in axial image 68. Some colonic stool noted in distal sigmoid colon. No distal colonic obstruction.  The uterus is normal size. No adnexal mass. No inguinal adenopathy. No destructive bony lesions are noted within pelvis.  Delayed renal images shows bilateral renal symmetrical excretion. Bilateral visualized proximal ureter is unremarkable.  IMPRESSION: 1. No acute inflammatory process within abdomen. 2. Moderate stool noted in right colon and transverse colon. 3. Normal appendix.  No pericecal inflammation. 4. No small bowel obstruction. 5. There is a redundant sigmoid colon. No colonic obstruction. No colitis or diverticulitis. Moderate gas noted in mid sigmoid colon. 6. Normal size uterus.  No  adnexal mass. 7. No hydronephrosis or hydroureter.   Electronically Signed   By: Natasha Mead M.D.   On: 02/16/2015 19:01    EKG Interpretation None      MDM   Final diagnoses:  LLQ pain    54 year old female with abdominal pain. Abdominal exam is relatively benign, but does have some tenderness in the left lower quadrant. Subsequent workup including CT abdomen pelvis is pretty unremarkable. It has been determined that no acute conditions requiring further emergency intervention are present at this time. The patient has been advised of the diagnosis and plan. I reviewed any labs and imaging including any potential incidental findings. We have discussed signs and symptoms that warrant return to the ED and they are listed in the discharge instructions.     Raeford Razor, MD 02/24/15 [phone]

## 2016-05-16 ENCOUNTER — Ambulatory Visit (INDEPENDENT_AMBULATORY_CARE_PROVIDER_SITE_OTHER): Payer: BC Managed Care – PPO

## 2016-05-16 DIAGNOSIS — Z23 Encounter for immunization: Secondary | ICD-10-CM

## 2017-08-09 ENCOUNTER — Ambulatory Visit (INDEPENDENT_AMBULATORY_CARE_PROVIDER_SITE_OTHER): Payer: BC Managed Care – PPO | Admitting: Otolaryngology

## 2017-08-09 DIAGNOSIS — J342 Deviated nasal septum: Secondary | ICD-10-CM | POA: Diagnosis not present

## 2017-08-09 DIAGNOSIS — J343 Hypertrophy of nasal turbinates: Secondary | ICD-10-CM | POA: Diagnosis not present

## 2017-08-09 DIAGNOSIS — J310 Chronic rhinitis: Secondary | ICD-10-CM

## 2017-08-13 ENCOUNTER — Other Ambulatory Visit (INDEPENDENT_AMBULATORY_CARE_PROVIDER_SITE_OTHER): Payer: Self-pay | Admitting: Otolaryngology

## 2017-08-13 DIAGNOSIS — J320 Chronic maxillary sinusitis: Secondary | ICD-10-CM

## 2017-08-19 ENCOUNTER — Ambulatory Visit (HOSPITAL_COMMUNITY)
Admission: RE | Admit: 2017-08-19 | Discharge: 2017-08-19 | Disposition: A | Discharge status: Home / Self Care | Payer: BC Managed Care – PPO | Source: Ambulatory Visit | Attending: Otolaryngology | Admitting: Otolaryngology

## 2017-08-19 DIAGNOSIS — J320 Chronic maxillary sinusitis: Secondary | ICD-10-CM | POA: Diagnosis not present

## 2018-02-07 ENCOUNTER — Other Ambulatory Visit: Payer: Self-pay

## 2018-02-07 DIAGNOSIS — I83893 Varicose veins of bilateral lower extremities with other complications: Secondary | ICD-10-CM

## 2018-02-22 ENCOUNTER — Other Ambulatory Visit: Payer: Self-pay | Admitting: Obstetrics and Gynecology

## 2018-02-22 DIAGNOSIS — R928 Other abnormal and inconclusive findings on diagnostic imaging of breast: Secondary | ICD-10-CM

## 2018-02-24 ENCOUNTER — Ambulatory Visit
Admission: RE | Admit: 2018-02-24 | Discharge: 2018-02-24 | Disposition: A | Discharge status: Home / Self Care | Payer: BC Managed Care – PPO | Source: Ambulatory Visit | Attending: Obstetrics and Gynecology | Admitting: Obstetrics and Gynecology

## 2018-02-24 ENCOUNTER — Ambulatory Visit: Payer: BC Managed Care – PPO

## 2018-02-24 DIAGNOSIS — R928 Other abnormal and inconclusive findings on diagnostic imaging of breast: Secondary | ICD-10-CM

## 2018-02-25 ENCOUNTER — Encounter: Payer: BC Managed Care – PPO | Admitting: Vascular Surgery

## 2018-02-25 ENCOUNTER — Encounter (HOSPITAL_COMMUNITY): Payer: BC Managed Care – PPO

## 2018-08-03 DIAGNOSIS — Z8616 Personal history of COVID-19: Secondary | ICD-10-CM

## 2018-08-03 HISTORY — DX: Personal history of COVID-19: Z86.16

## 2019-01-23 ENCOUNTER — Other Ambulatory Visit: Payer: Self-pay | Admitting: Obstetrics and Gynecology

## 2019-01-23 DIAGNOSIS — Z1231 Encounter for screening mammogram for malignant neoplasm of breast: Secondary | ICD-10-CM

## 2019-03-08 ENCOUNTER — Ambulatory Visit: Payer: BC Managed Care – PPO

## 2020-01-11 ENCOUNTER — Other Ambulatory Visit: Payer: Self-pay | Admitting: Obstetrics and Gynecology

## 2020-01-11 DIAGNOSIS — Z1231 Encounter for screening mammogram for malignant neoplasm of breast: Secondary | ICD-10-CM

## 2020-01-23 ENCOUNTER — Ambulatory Visit
Admission: RE | Admit: 2020-01-23 | Discharge: 2020-01-23 | Disposition: A | Discharge status: Home / Self Care | Payer: BC Managed Care – PPO | Source: Ambulatory Visit | Attending: Obstetrics and Gynecology | Admitting: Obstetrics and Gynecology

## 2020-01-23 ENCOUNTER — Other Ambulatory Visit: Payer: Self-pay

## 2020-01-23 DIAGNOSIS — Z1231 Encounter for screening mammogram for malignant neoplasm of breast: Secondary | ICD-10-CM

## 2020-09-23 ENCOUNTER — Other Ambulatory Visit: Payer: Self-pay | Admitting: Urology

## 2020-09-23 DIAGNOSIS — N201 Calculus of ureter: Secondary | ICD-10-CM

## 2020-09-25 NOTE — Progress Notes (Signed)
Patient to arrive at 0645 on 09/30/2020. History and medications reviewed. Pre-procedure instructions given. NPO after MN on Sunday except for clear liquids until 0445. Driver secured.

## 2020-09-26 ENCOUNTER — Other Ambulatory Visit (HOSPITAL_COMMUNITY)
Admission: RE | Admit: 2020-09-26 | Discharge: 2020-09-26 | Disposition: A | Discharge status: Home / Self Care | Payer: BC Managed Care – PPO | Source: Ambulatory Visit | Attending: Urology | Admitting: Urology

## 2020-09-26 DIAGNOSIS — Z01812 Encounter for preprocedural laboratory examination: Secondary | ICD-10-CM | POA: Diagnosis not present

## 2020-09-26 DIAGNOSIS — Z20822 Contact with and (suspected) exposure to covid-19: Secondary | ICD-10-CM | POA: Insufficient documentation

## 2020-09-27 LAB — SARS CORONAVIRUS 2 (TAT 6-24 HRS): SARS Coronavirus 2: NEGATIVE

## 2020-09-30 ENCOUNTER — Encounter (HOSPITAL_BASED_OUTPATIENT_CLINIC_OR_DEPARTMENT_OTHER)
Admission: RE | Disposition: A | Discharge status: Home / Self Care | Payer: Self-pay | Source: Home / Self Care | Attending: Urology

## 2020-09-30 ENCOUNTER — Other Ambulatory Visit: Payer: Self-pay

## 2020-09-30 ENCOUNTER — Ambulatory Visit (HOSPITAL_BASED_OUTPATIENT_CLINIC_OR_DEPARTMENT_OTHER)
Admission: RE | Admit: 2020-09-30 | Discharge: 2020-09-30 | Disposition: A | Discharge status: Home / Self Care | Payer: BC Managed Care – PPO | Attending: Urology | Admitting: Urology

## 2020-09-30 ENCOUNTER — Encounter (HOSPITAL_BASED_OUTPATIENT_CLINIC_OR_DEPARTMENT_OTHER): Payer: Self-pay | Admitting: Urology

## 2020-09-30 ENCOUNTER — Ambulatory Visit (HOSPITAL_COMMUNITY): Payer: BC Managed Care – PPO

## 2020-09-30 DIAGNOSIS — Z6829 Body mass index (BMI) 29.0-29.9, adult: Secondary | ICD-10-CM | POA: Diagnosis not present

## 2020-09-30 DIAGNOSIS — Z880 Allergy status to penicillin: Secondary | ICD-10-CM | POA: Insufficient documentation

## 2020-09-30 DIAGNOSIS — E669 Obesity, unspecified: Secondary | ICD-10-CM | POA: Insufficient documentation

## 2020-09-30 DIAGNOSIS — N201 Calculus of ureter: Secondary | ICD-10-CM | POA: Diagnosis not present

## 2020-09-30 DIAGNOSIS — N200 Calculus of kidney: Secondary | ICD-10-CM

## 2020-09-30 HISTORY — PX: EXTRACORPOREAL SHOCK WAVE LITHOTRIPSY: SHX1557

## 2020-09-30 SURGERY — LITHOTRIPSY, ESWL
Anesthesia: LOCAL | Laterality: Left

## 2020-09-30 MED ORDER — DIAZEPAM 5 MG PO TABS
10.0000 mg | ORAL_TABLET | Freq: Once | ORAL | Status: AC
  Administered 2020-09-30: 10 mg via ORAL

## 2020-09-30 MED ORDER — SODIUM CHLORIDE 0.9 % IV SOLN: INTRAVENOUS | Status: DC

## 2020-09-30 MED ORDER — OXYCODONE-ACETAMINOPHEN 5-325 MG PO TABS: 1.0000 | ORAL_TABLET | Freq: Four times a day (QID) | ORAL | 0 refills | Status: DC | PRN

## 2020-09-30 MED ORDER — TAMSULOSIN HCL 0.4 MG PO CAPS: 0.4000 mg | ORAL_CAPSULE | Freq: Every day | ORAL | 0 refills | Status: AC

## 2020-09-30 MED ORDER — DIPHENHYDRAMINE HCL 25 MG PO CAPS
ORAL_CAPSULE | ORAL | Status: AC
  Filled 2020-09-30: qty 1

## 2020-09-30 MED ORDER — DIPHENHYDRAMINE HCL 25 MG PO CAPS
25.0000 mg | ORAL_CAPSULE | Freq: Once | ORAL | Status: AC
  Administered 2020-09-30: 25 mg via ORAL

## 2020-09-30 MED ORDER — CIPROFLOXACIN HCL 500 MG PO TABS
500.0000 mg | ORAL_TABLET | Freq: Once | ORAL | Status: AC
  Administered 2020-09-30: 500 mg via ORAL

## 2020-09-30 MED ORDER — SENNOSIDES-DOCUSATE SODIUM 8.6-50 MG PO TABS: 1.0000 | ORAL_TABLET | Freq: Two times a day (BID) | ORAL | 0 refills | Status: AC

## 2020-09-30 MED ORDER — CIPROFLOXACIN HCL 500 MG PO TABS
ORAL_TABLET | ORAL | Status: AC
  Filled 2020-09-30: qty 1

## 2020-09-30 MED ORDER — DIAZEPAM 5 MG PO TABS
ORAL_TABLET | ORAL | Status: AC
  Filled 2020-09-30: qty 2

## 2020-09-30 NOTE — Brief Op Note (Signed)
09/30/2020  8:27 AM  PATIENT:  Caitlyn [name]  60 y.o. female  PRE-OPERATIVE DIAGNOSIS:  LEFT PROXIMAL URETERAL STONE  POST-OPERATIVE DIAGNOSIS:  * No post-op diagnosis entered *  PROCEDURE:  Procedure(s): EXTRACORPOREAL SHOCK WAVE LITHOTRIPSY (ESWL) (Left)  SURGEON:  Surgeon(s) and Role:    * Alexis Frock, MD - Primary  PHYSICIAN ASSISTANT:   ASSISTANTS: none   ANESTHESIA:   MAC  EBL:  minimal   BLOOD ADMINISTERED:none  DRAINS: none   LOCAL MEDICATIONS USED:  NONE  SPECIMEN:  No Specimen  DISPOSITION OF SPECIMEN:  N/A  COUNTS:  YES  TOURNIQUET:  * No tourniquets in log *  DICTATION: .Note written in paper chart  PLAN OF CARE: Discharge to home after PACU  PATIENT DISPOSITION:  Short Stay   Delay start of Pharmacological VTE agent (>24hrs) due to surgical blood loss or risk of bleeding: yes

## 2020-09-30 NOTE — H&P (Signed)
Caitlyn [name] is an 60 y.o. female.    Chief Complaint: Pre-Op LEFT Shockwave Lithotripsy  HPI:   1 - LEFT Ureteral Stone - 29mm solitary left proximal uyreteral stone by CT in [facility] 09/2020. KUB at L3-L4 level on follow up.  Today "Caitlyn [name]" is seen for LEFT shockwave lithotripsy for solitary ureteral stone. Most rectn UCX negative, C19 screen negative, no interval fevers.   Past Medical History:  Diagnosis Date  . Depression   . GERD (gastroesophageal reflux disease)   . Thyroid disease     Past Surgical History:  Procedure Laterality Date  . BREAST BIOPSY    . BREAST EXCISIONAL BIOPSY Left 2000  . CESAREAN SECTION      Family History  Problem Relation Age of Onset  . Breast cancer Mother 56   Social History:  reports that she has never smoked. She does not have any smokeless tobacco history on file. She reports that she does not drink alcohol and does not use drugs.  Allergies:  Allergies  Allergen Reactions  . Penicillins Rash    No medications prior to admission.    No results found for this or any previous visit (from the past 48 hour(s)). No results found.  Review of Systems  Constitutional: Negative for chills and fever.  Genitourinary: Positive for flank pain.    There were no vitals taken for this visit. Physical Exam HENT:     Head: Normocephalic.  Eyes:     Pupils: Pupils are equal, round, and reactive to light.  Cardiovascular:     Rate and Rhythm: Normal rate.  Pulmonary:     Effort: Pulmonary effort is normal.  Abdominal:     General: Abdomen is flat.  Genitourinary:    Comments: Mild left CVAT at present.  Musculoskeletal:        General: Normal range of motion.     Cervical back: Normal range of motion.  Skin:    General: Skin is warm.  Neurological:     General: No focal deficit present.     Mental Status: She is alert.  Psychiatric:        Mood and Affect: Mood normal.      Assessment/Plan  Proceed as planned with LEFT  shockewave lithotripsy. Risks, benefits, alternatives, expected peri-o pcourse discussed previously and reiterated today.   Sebastian Ache, MD 09/30/2020, 5:29 AM

## 2020-09-30 NOTE — Discharge Instructions (Signed)
1 - You may have urinary urgency (bladder spasms), pass small stone fragments,  and bloody urine on / off for up to 3 weeks.. This is normal.  2 - Call MD or go to ER for fever >102, severe pain / nausea / vomiting not relieved by medications, or acute change in medical status

## 2020-10-01 ENCOUNTER — Encounter (HOSPITAL_BASED_OUTPATIENT_CLINIC_OR_DEPARTMENT_OTHER): Payer: Self-pay | Admitting: Urology

## 2021-01-14 ENCOUNTER — Other Ambulatory Visit: Payer: Self-pay | Admitting: Obstetrics and Gynecology

## 2021-01-14 DIAGNOSIS — Z1231 Encounter for screening mammogram for malignant neoplasm of breast: Secondary | ICD-10-CM

## 2021-01-28 ENCOUNTER — Ambulatory Visit: Payer: BC Managed Care – PPO

## 2021-01-29 ENCOUNTER — Ambulatory Visit
Admission: RE | Admit: 2021-01-29 | Discharge: 2021-01-29 | Disposition: A | Discharge status: Home / Self Care | Payer: BC Managed Care – PPO | Source: Ambulatory Visit | Attending: Obstetrics and Gynecology | Admitting: Obstetrics and Gynecology

## 2021-01-29 ENCOUNTER — Other Ambulatory Visit: Payer: Self-pay

## 2021-01-29 DIAGNOSIS — Z1231 Encounter for screening mammogram for malignant neoplasm of breast: Secondary | ICD-10-CM

## 2021-04-18 ENCOUNTER — Other Ambulatory Visit: Payer: Self-pay | Admitting: Urology

## 2021-05-20 ENCOUNTER — Other Ambulatory Visit: Payer: Self-pay

## 2021-05-20 ENCOUNTER — Encounter (HOSPITAL_BASED_OUTPATIENT_CLINIC_OR_DEPARTMENT_OTHER): Payer: Self-pay | Admitting: Urology

## 2021-05-20 NOTE — Progress Notes (Signed)
Spoke w/ via phone for pre-op interview--- pt Lab needs dos----  no             Lab results------ no COVID test -----patient states asymptomatic no test needed Arrive at ------- 0815 on 05-23-2021 NPO after MN NO Solid Food.  Clear liquids from MN until--- 0715 Med rec completed Medications to take morning of surgery ----- synthyroid, lexapro Diabetic medication ----- Patient instructed no nail polish to be worn day of surgery Patient instructed to bring photo id and insurance card day of surgery Patient aware to have Driver (ride ) / caregiver for 24 hours after surgery --husband, Caitlyn [name] Patient Special Instructions ----- n/a Pre-Op special Istructions ----- n/a Patient verbalized understanding of instructions that were given at this phone interview. Patient denies shortness of breath, chest pain, fever, cough at this phone interview.

## 2021-05-23 ENCOUNTER — Other Ambulatory Visit: Payer: Self-pay

## 2021-05-23 ENCOUNTER — Encounter (HOSPITAL_BASED_OUTPATIENT_CLINIC_OR_DEPARTMENT_OTHER)
Admission: RE | Disposition: A | Discharge status: Home / Self Care | Payer: Self-pay | Source: Home / Self Care | Attending: Urology

## 2021-05-23 ENCOUNTER — Ambulatory Visit (HOSPITAL_BASED_OUTPATIENT_CLINIC_OR_DEPARTMENT_OTHER): Payer: BC Managed Care – PPO | Admitting: Certified Registered"

## 2021-05-23 ENCOUNTER — Encounter (HOSPITAL_BASED_OUTPATIENT_CLINIC_OR_DEPARTMENT_OTHER): Payer: Self-pay | Admitting: Urology

## 2021-05-23 ENCOUNTER — Ambulatory Visit (HOSPITAL_BASED_OUTPATIENT_CLINIC_OR_DEPARTMENT_OTHER)
Admission: RE | Admit: 2021-05-23 | Discharge: 2021-05-23 | Disposition: A | Discharge status: Home / Self Care | Payer: BC Managed Care – PPO | Attending: Urology | Admitting: Urology

## 2021-05-23 DIAGNOSIS — Z880 Allergy status to penicillin: Secondary | ICD-10-CM | POA: Insufficient documentation

## 2021-05-23 DIAGNOSIS — Z8616 Personal history of COVID-19: Secondary | ICD-10-CM | POA: Insufficient documentation

## 2021-05-23 DIAGNOSIS — N200 Calculus of kidney: Secondary | ICD-10-CM | POA: Diagnosis present

## 2021-05-23 DIAGNOSIS — N201 Calculus of ureter: Secondary | ICD-10-CM

## 2021-05-23 HISTORY — DX: Hypothyroidism, unspecified: E03.9

## 2021-05-23 HISTORY — DX: Presence of spectacles and contact lenses: Z97.3

## 2021-05-23 HISTORY — PX: CYSTOSCOPY/URETEROSCOPY/HOLMIUM LASER/STENT PLACEMENT: SHX6546

## 2021-05-23 HISTORY — DX: Calculus of ureter: N20.1

## 2021-05-23 HISTORY — DX: Personal history of urinary calculi: Z87.442

## 2021-05-23 SURGERY — CYSTOSCOPY/URETEROSCOPY/HOLMIUM LASER/STENT PLACEMENT
Anesthesia: General | Site: Bladder | Laterality: Bilateral

## 2021-05-23 MED ORDER — CIPROFLOXACIN IN D5W 400 MG/200ML IV SOLN
INTRAVENOUS | Status: AC
  Filled 2021-05-23: qty 200

## 2021-05-23 MED ORDER — DIPHENHYDRAMINE HCL 25 MG PO CAPS: 25.0000 mg | ORAL_CAPSULE | ORAL | Status: DC

## 2021-05-23 MED ORDER — ACETAMINOPHEN 325 MG PO TABS: 325.0000 mg | ORAL_TABLET | ORAL | Status: DC | PRN

## 2021-05-23 MED ORDER — ONDANSETRON HCL 4 MG/2ML IJ SOLN
INTRAMUSCULAR | Status: AC
  Filled 2021-05-23: qty 2

## 2021-05-23 MED ORDER — SODIUM CHLORIDE 0.9 % IV SOLN: INTRAVENOUS | Status: DC

## 2021-05-23 MED ORDER — FENTANYL CITRATE (PF) 100 MCG/2ML IJ SOLN
INTRAMUSCULAR | Status: AC
  Filled 2021-05-23: qty 2

## 2021-05-23 MED ORDER — OXYCODONE HCL 5 MG PO TABS
5.0000 mg | ORAL_TABLET | Freq: Once | ORAL | Status: AC | PRN
  Administered 2021-05-23: 5 mg via ORAL

## 2021-05-23 MED ORDER — PROPOFOL 10 MG/ML IV BOLUS
INTRAVENOUS | Status: DC | PRN
  Administered 2021-05-23: 160 mg via INTRAVENOUS

## 2021-05-23 MED ORDER — EPHEDRINE 5 MG/ML INJ
INTRAVENOUS | Status: AC
  Filled 2021-05-23: qty 5

## 2021-05-23 MED ORDER — SCOPOLAMINE 1 MG/3DAYS TD PT72: 1.0000 | MEDICATED_PATCH | TRANSDERMAL | Status: DC

## 2021-05-23 MED ORDER — ACETAMINOPHEN 10 MG/ML IV SOLN: 1000.0000 mg | Freq: Once | INTRAVENOUS | Status: DC | PRN

## 2021-05-23 MED ORDER — LIDOCAINE 2% (20 MG/ML) 5 ML SYRINGE
INTRAMUSCULAR | Status: DC | PRN
  Administered 2021-05-23: 40 mg via INTRAVENOUS

## 2021-05-23 MED ORDER — FENTANYL CITRATE (PF) 100 MCG/2ML IJ SOLN
INTRAMUSCULAR | Status: DC | PRN
  Administered 2021-05-23: 50 ug via INTRAVENOUS
  Administered 2021-05-23: 25 ug via INTRAVENOUS

## 2021-05-23 MED ORDER — OXYCODONE HCL 5 MG PO TABS
ORAL_TABLET | ORAL | Status: AC
  Filled 2021-05-23: qty 1

## 2021-05-23 MED ORDER — AMISULPRIDE (ANTIEMETIC) 5 MG/2ML IV SOLN: 10.0000 mg | Freq: Once | INTRAVENOUS | Status: DC | PRN

## 2021-05-23 MED ORDER — PROPOFOL 10 MG/ML IV BOLUS
INTRAVENOUS | Status: AC
  Filled 2021-05-23: qty 20

## 2021-05-23 MED ORDER — DIAZEPAM 5 MG PO TABS: 10.0000 mg | ORAL_TABLET | ORAL | Status: DC

## 2021-05-23 MED ORDER — KETOROLAC TROMETHAMINE 30 MG/ML IJ SOLN
INTRAMUSCULAR | Status: DC | PRN
  Administered 2021-05-23: 30 mg via INTRAVENOUS

## 2021-05-23 MED ORDER — MIDAZOLAM HCL 2 MG/2ML IJ SOLN
INTRAMUSCULAR | Status: DC | PRN
  Administered 2021-05-23: 2 mg via INTRAVENOUS

## 2021-05-23 MED ORDER — PROMETHAZINE HCL 25 MG/ML IJ SOLN: 6.2500 mg | INTRAMUSCULAR | Status: DC | PRN

## 2021-05-23 MED ORDER — DEXAMETHASONE SODIUM PHOSPHATE 10 MG/ML IJ SOLN
INTRAMUSCULAR | Status: DC | PRN
  Administered 2021-05-23 (×2): 5 mg via INTRAVENOUS

## 2021-05-23 MED ORDER — WHITE PETROLATUM EX OINT
TOPICAL_OINTMENT | CUTANEOUS | Status: AC
  Filled 2021-05-23: qty 5

## 2021-05-23 MED ORDER — LIDOCAINE 2% (20 MG/ML) 5 ML SYRINGE
INTRAMUSCULAR | Status: AC
  Filled 2021-05-23: qty 5

## 2021-05-23 MED ORDER — OXYCODONE HCL 5 MG/5ML PO SOLN: 5.0000 mg | Freq: Once | ORAL | Status: AC | PRN

## 2021-05-23 MED ORDER — FENTANYL CITRATE (PF) 100 MCG/2ML IJ SOLN: 25.0000 ug | INTRAMUSCULAR | Status: DC | PRN

## 2021-05-23 MED ORDER — ONDANSETRON HCL 4 MG/2ML IJ SOLN
INTRAMUSCULAR | Status: DC | PRN
  Administered 2021-05-23: 4 mg via INTRAVENOUS

## 2021-05-23 MED ORDER — CIPROFLOXACIN IN D5W 400 MG/200ML IV SOLN
400.0000 mg | INTRAVENOUS | Status: AC
  Administered 2021-05-23: 400 mg via INTRAVENOUS

## 2021-05-23 MED ORDER — OXYCODONE-ACETAMINOPHEN 5-325 MG PO TABS: 1.0000 | ORAL_TABLET | ORAL | 0 refills | Status: AC | PRN

## 2021-05-23 MED ORDER — MIDAZOLAM HCL 2 MG/2ML IJ SOLN
INTRAMUSCULAR | Status: AC
  Filled 2021-05-23: qty 2

## 2021-05-23 MED ORDER — DEXAMETHASONE SODIUM PHOSPHATE 10 MG/ML IJ SOLN
INTRAMUSCULAR | Status: AC
  Filled 2021-05-23: qty 1

## 2021-05-23 MED ORDER — LACTATED RINGERS IV SOLN: INTRAVENOUS | Status: DC

## 2021-05-23 MED ORDER — CIPROFLOXACIN HCL 500 MG PO TABS: 500.0000 mg | ORAL_TABLET | ORAL | Status: DC

## 2021-05-23 MED ORDER — KETOROLAC TROMETHAMINE 30 MG/ML IJ SOLN
INTRAMUSCULAR | Status: AC
  Filled 2021-05-23: qty 1

## 2021-05-23 MED ORDER — IOHEXOL 300 MG/ML  SOLN
INTRAMUSCULAR | Status: DC | PRN
  Administered 2021-05-23: 20 mL via URETHRAL

## 2021-05-23 MED ORDER — ACETAMINOPHEN 160 MG/5ML PO SOLN: 325.0000 mg | ORAL | Status: DC | PRN

## 2021-05-23 SURGICAL SUPPLY — 26 items
BAG DRAIN URO-CYSTO SKYTR STRL (DRAIN) ×2 IMPLANT
BAG DRN UROCATH (DRAIN) ×1
BASKET ZERO TIP NITINOL 2.4FR (BASKET) ×2 IMPLANT
BSKT STON RTRVL ZERO TP 2.4FR (BASKET) ×1
CATH SET URETHRAL DILATOR (CATHETERS) IMPLANT
CATH URET 5FR 28IN OPEN ENDED (CATHETERS) ×2 IMPLANT
CLOTH BEACON ORANGE TIMEOUT ST (SAFETY) ×2 IMPLANT
COVER DOME SNAP 22 D (MISCELLANEOUS) ×2 IMPLANT
FIBER LASER FLEXIVA 365 (UROLOGICAL SUPPLIES) IMPLANT
GLOVE SURG ENC MOIS LTX SZ7 (GLOVE) ×2 IMPLANT
GOWN STRL REUS W/TWL LRG LVL3 (GOWN DISPOSABLE) ×2 IMPLANT
GUIDEWIRE STR DUAL SENSOR (WIRE) ×2 IMPLANT
GUIDEWIRE ZIPWRE .038 STRAIGHT (WIRE) ×6 IMPLANT
IV NS 1000ML (IV SOLUTION) ×2
IV NS 1000ML BAXH (IV SOLUTION) ×1 IMPLANT
IV NS IRRIG 3000ML ARTHROMATIC (IV SOLUTION) ×4 IMPLANT
KIT TURNOVER CYSTO (KITS) ×2 IMPLANT
MANIFOLD NEPTUNE II (INSTRUMENTS) ×2 IMPLANT
NS IRRIG 500ML POUR BTL (IV SOLUTION) ×2 IMPLANT
PACK CYSTO (CUSTOM PROCEDURE TRAY) ×2 IMPLANT
SYR 10ML LL (SYRINGE) ×2 IMPLANT
TRACTIP FLEXIVA PULS ID 200XHI (Laser) ×1 IMPLANT
TRACTIP FLEXIVA PULSE ID 200 (Laser) ×2
TUBE CONNECTING 12X1/4 (SUCTIONS) ×2 IMPLANT
TUBE FEEDING 8FR 16IN STR KANG (MISCELLANEOUS) IMPLANT
TUBING UROLOGY SET (TUBING) ×2 IMPLANT

## 2021-05-23 NOTE — Anesthesia Preprocedure Evaluation (Addendum)
Anesthesia Evaluation  Patient identified by MRN, date of birth, ID band Patient awake    Reviewed: Allergy & Precautions, NPO status , Patient's Chart, lab work & pertinent test results  Airway Mallampati: I  TM Distance: >3 FB Neck ROM: Full    Dental  (+) Teeth Intact, Dental Advisory Given   Pulmonary neg pulmonary ROS,    breath sounds clear to auscultation       Cardiovascular negative cardio ROS   Rhythm:Regular Rate:Normal     Neuro/Psych PSYCHIATRIC DISORDERS Depression negative neurological ROS     GI/Hepatic Neg liver ROS, GERD  ,  Endo/Other  Hypothyroidism   Renal/GU negative Renal ROS     Musculoskeletal negative musculoskeletal ROS (+)   Abdominal Normal abdominal exam  (+)   Peds  Hematology negative hematology ROS (+)   Anesthesia Other Findings   Reproductive/Obstetrics                            Anesthesia Physical Anesthesia Plan  ASA: 2  Anesthesia Plan: General   Post-op Pain Management:    Induction: Intravenous  PONV Risk Score and Plan: 4 or greater and Ondansetron, Dexamethasone, Midazolam and Scopolamine patch - Pre-op  Airway Management Planned: LMA  Additional Equipment: None  Intra-op Plan:   Post-operative Plan: Extubation in OR  Informed Consent: I have reviewed the patients History and Physical, chart, labs and discussed the procedure including the risks, benefits and alternatives for the proposed anesthesia with the patient or authorized representative who has indicated his/her understanding and acceptance.     Dental advisory given  Plan Discussed with: CRNA  Anesthesia Plan Comments:        Anesthesia Quick Evaluation

## 2021-05-23 NOTE — H&P (Signed)
Urology Preoperative H&P   Chief Complaint: Bilateral renal stones  History of Present Illness: Caitlyn [name] is a 60 y.o. female with bilateral renal stones here for cysto, b/l RPG, b/l URS/LL, possible b/l stent placement. Denies fevers, chills  or dysuria.   Past Medical History:  Diagnosis Date   Depression    GERD (gastroesophageal reflux disease)    no meds, avoids caffine   History of COVID-19 2020   History of gastritis 2012   History of kidney stones    Hypothyroidism    Left ureteral calculus    Wears glasses     Past Surgical History:  Procedure Laterality Date   BREAST EXCISIONAL BIOPSY Left    2002 and 2012  (per pt benign)   CESAREAN SECTION     x3  last one 04-21-2002 @ [facility];  W/  BILATERAL TUBAL LIGATION   EXTRACORPOREAL SHOCK WAVE LITHOTRIPSY Left 09/30/2020   Procedure: EXTRACORPOREAL SHOCK WAVE LITHOTRIPSY (ESWL);  Surgeon: Sebastian Ache, MD;  Location: [facility];  Service: Urology;  Laterality: Left;    Allergies:  Allergies  Allergen Reactions   Penicillins Itching and Rash    Family History  Problem Relation Age of Onset   Breast cancer Mother 24    Social History:  reports that she has never smoked. She has never used smokeless tobacco. She reports that she does not currently use alcohol. She reports that she does not use drugs.  ROS: A complete review of systems was performed.  All systems are negative except for pertinent findings as noted.  Physical Exam:  Vital signs in last 24 hours: Temp:  [97.9 F (36.6 C)] 97.9 F (36.6 C) (10/21 0756) Pulse Rate:  [62] 62 (10/21 0756) Resp:  [17] 17 (10/21 0756) BP: (133)/(70) 133/70 (10/21 0756) SpO2:  [99 %] 99 % (10/21 0756) Weight:  [85 kg] 85 kg (10/21 0756) Constitutional:  Alert and oriented, No acute distress Cardiovascular: Regular rate and rhythm Respiratory: Normal respiratory effort, Lungs clear bilaterally GI: Abdomen is soft, nontender, nondistended, no  abdominal masses GU: No CVA tenderness Lymphatic: No lymphadenopathy Neurologic: Grossly intact, no focal deficits Psychiatric: Normal mood and affect  Laboratory Data:  No results for input(s): WBC, HGB, HCT, PLT in the last 72 hours.  No results for input(s): NA, K, CL, GLUCOSE, BUN, CALCIUM, CREATININE in the last 72 hours.  Invalid input(s): CO3   No results found for this or any previous visit (from the past 24 hour(s)). No results found for this or any previous visit (from the past 240 hour(s)).  Renal Function: No results for input(s): CREATININE in the last 168 hours. CrCl cannot be calculated (Patient's most recent lab result is older than the maximum 21 days allowed.).  Radiologic Imaging: No results found.  I independently reviewed the above imaging studies.  Assessment and Plan [name] is a 60 y.o. female with cysto, b/l RPG, b/l URS/LL, possible b/l stent placement.  -The risks, benefits and alternatives of ysto, b/l RPG, b/l URS/LL, possible b/l stent placement was discussed with the patient.  Risks include, but are not limited to: bleeding, urinary tract infection, ureteral injury, ureteral stricture disease, chronic pain, urinary symptoms, bladder injury, stent migration, the need for nephrostomy tube placement, MI, CVA, DVT, PE and the inherent risks with general anesthesia.  The patient voices understanding and wishes to proceed.       Matt R. Daymeon Fischman MD 05/23/2021, 8:14 AM  [facility] Pager: [phone]): [phone]

## 2021-05-23 NOTE — Transfer of Care (Signed)
Immediate Anesthesia Transfer of Care Note  Patient: Caitlyn [name]  Procedure(s) Performed: CYSTOSCOPY/RETROGRADE/URETEROSCOPY/HOLMIUM LASER/STENT PLACEMENT (Bilateral: Bladder)  Patient Location: PACU  Anesthesia Type:General  Level of Consciousness: sedated  Airway & Oxygen Therapy: Patient Spontanous Breathing and Patient connected to nasal cannula oxygen  Post-op Assessment: Report given to RN  Post vital signs: Reviewed and stable  Last Vitals:  Vitals Value Taken Time  BP 124/67 05/23/21 1115  Temp    Pulse 65 05/23/21 1115  Resp 9 05/23/21 1115  SpO2 97 % 05/23/21 1115  Vitals shown include unvalidated device data.  Last Pain:  Vitals:   05/23/21 0756  TempSrc: Oral         Complications: No notable events documented.

## 2021-05-23 NOTE — Anesthesia Procedure Notes (Signed)
Procedure Name: LMA Insertion Date/Time: 05/23/2021 10:21 AM Performed by: Francie Massing, CRNA Pre-anesthesia Checklist: Patient identified, Emergency Drugs available, Suction available and Patient being monitored Patient Re-evaluated:Patient Re-evaluated prior to induction Oxygen Delivery Method: Circle system utilized Preoxygenation: Pre-oxygenation with 100% oxygen Induction Type: IV induction Ventilation: Mask ventilation without difficulty LMA: LMA inserted LMA Size: 4.0 Number of attempts: 1 Airway Equipment and Method: Bite block Placement Confirmation: positive ETCO2 Tube secured with: Tape Dental Injury: Teeth and Oropharynx as per pre-operative assessment

## 2021-05-23 NOTE — Discharge Instructions (Addendum)
[facility] [phone] Post Ureteroscopy With or Without Stent Instructions  Definitions:  Ureter: The duct that transports urine from the kidney to the bladder. Stent:   A plastic hollow tube that is placed into the ureter, from the kidney to the bladder to prevent the ureter from swelling shut.  GENERAL INSTRUCTIONS:  Despite the fact that no skin incisions were used, the area around the ureter and bladder is raw and irritated. The stent is a foreign body which will further irritate the bladder wall. This irritation is manifested by increased frequency of urination, both day and night, and by an increase in the urge to urinate. In some, the urge to urinate is present almost always. Sometimes the urge is strong enough that you may not be able to stop yourself from urinating. The only real cure is to remove the stent and then give time for the bladder wall to heal which can't be done until the danger of the ureter swelling shut has passed, which varies.  You may see some blood in your urine while the stent is in place and a few days afterwards. Do not be alarmed, even if the urine was clear for a while. Get off your feet and drink lots of fluids until clearing occurs. If you start to pass clots or don't improve, call us.  DIET: You may return to your normal diet immediately. Because of the raw surface of your bladder, alcohol, spicy foods, acid type foods and drinks with caffeine may cause irritation or frequency and should be used in moderation. To keep your urine flowing freely and to avoid constipation, drink plenty of fluids during the day ( 8-10 glasses ). Tip: Avoid cranberry juice because it is very acidic.  ACTIVITY: Your physical activity doesn't need to be restricted. However, if you are very active, you may see some blood in your urine. We suggest that you reduce your activity under these circumstances until the bleeding has stopped.  BOWELS: It is important to  keep your bowels regular during the postoperative period. Straining with bowel movements can cause bleeding. A bowel movement every other day is reasonable. Use a mild laxative if needed, such as Milk of Magnesia 2-3 tablespoons, or 2 Dulcolax tablets. Call if you continue to have problems. If you have been taking narcotics for pain, before, during or after your surgery, you may be constipated. Take a laxative if necessary.   MEDICATION: You should resume your pre-surgery medications unless told not to. In addition you will often be given an antibiotic to prevent infection. These should be taken as prescribed until the bottles are finished unless you are having an unusual reaction to one of the drugs.  PROBLEMS YOU SHOULD REPORT TO [place]: Fevers over 100.5 Fahrenheit. Heavy bleeding, or clots ( See above notes about blood in urine ). Inability to urinate. Drug reactions ( hives, rash, nausea, vomiting, diarrhea ). Severe burning or pain with urination that is not improving.  FOLLOW-UP: You will need a follow-up appointment to monitor your progress. Call for this appointment at the number listed above. Usually the first appointment will be about three to fourteen days after your surgery.   Post Anesthesia Home Care Instructions  Activity: Get plenty of rest for the remainder of the day. A responsible individual must stay with you for 24 hours following the procedure.  For the next 24 hours, DO NOT: -Drive a car -Paediatric nurse -Drink alcoholic beverages -Take any medication unless instructed by your physician -Make  any legal decisions or sign important papers.  Meals: Start with liquid foods such as gelatin or soup. Progress to regular foods as tolerated. Avoid greasy, spicy, heavy foods. If nausea and/or vomiting occur, drink only clear liquids until the nausea and/or vomiting subsides. Call your physician if vomiting continues.  Special Instructions/Symptoms: Your throat may feel dry  or sore from the anesthesia or the breathing tube placed in your throat during surgery. If this causes discomfort, gargle with warm salt water. The discomfort should disappear within 24 hours.  If you had a scopolamine patch placed behind your ear for the management of post- operative nausea and/or vomiting:  1. The medication in the patch is effective for 72 hours, after which it should be removed.  Wrap patch in a tissue and discard in the trash. Wash hands thoroughly with soap and water. 2. You may remove the patch earlier than 72 hours if you experience unpleasant side effects which may include dry mouth, dizziness or visual disturbances. 3. Avoid touching the patch. Wash your hands with soap and water after contact with the patch.    No ibuprofen, Advil, Aleve, Motrin, ketorolac, meloxicam, or naproxen until after 5 pm today if needed.

## 2021-05-23 NOTE — Anesthesia Postprocedure Evaluation (Signed)
Anesthesia Post Note  Patient: Caitlyn [name]  Procedure(s) Performed: CYSTOSCOPY/RETROGRADE/URETEROSCOPY/HOLMIUM LASER/STENT PLACEMENT (Bilateral: Bladder)     Patient location during evaluation: PACU Anesthesia Type: General Level of consciousness: awake and alert Pain management: pain level controlled Vital Signs Assessment: post-procedure vital signs reviewed and stable Respiratory status: spontaneous breathing, nonlabored ventilation, respiratory function stable and patient connected to nasal cannula oxygen Cardiovascular status: blood pressure returned to baseline and stable Postop Assessment: no apparent nausea or vomiting Anesthetic complications: no   No notable events documented.  Last Vitals:  Vitals:   05/23/21 1145 05/23/21 1225  BP: (!) 144/75 129/64  Pulse: 63 64  Resp: 12 16  Temp: 36.5 C (!) 36.3 C  SpO2: 96% 97%    Last Pain:  Vitals:   05/23/21 1225  TempSrc: Axillary  PainSc:                  [name]

## 2021-05-23 NOTE — Op Note (Signed)
Operative Note  Preoperative diagnosis:  1.  Bilateral renal stones  Postoperative diagnosis: 1.  Bilateral renal stones  Procedure(s): 1.  Cystoscopy 2. Bilateral ureteroscopy with laser lithotripsy and basket extraction of stones 3. Bilateral retrograde pyelogram 4. Fluoroscopy with intraoperative interpretation  Surgeon: Jettie Pagan, MD  Assistants:  None  Anesthesia:  General  Complications:  None  EBL:  Minimal  Specimens: 1. Stones for stone analysis (to be done at [facility])  Drains/Catheters: 1.  NONE  Intraoperative findings:   Cystoscopy demonstrated no suspicious bladder lesions Left ureteroscopy demonstrated no left ureteral stones.  Left pyeloscopy demonstrated 3 separate tiny 1-2 mm stones as well as several Randall's plaques.  These were all fragmented.  All these fragments for less than the size of the fiber and irrigated away easily.  A portion of these fragments were basket extracted. Right ureteroscopy demonstrated no right ureteral stones.  Right pyeloscopy demonstrated 2 separate tiny 1-2 mm stones.  These were fragmented.  All of these fragments were less than the size fiber and irrigated away easily. A portion of these fragments were basket extracted. Bilateral retrograde pyelograms demonstrated no hydronephrosis I elected not to leave a stent as these stone fragments were very small and there was no trauma or edema in both of Caitlyn [name] ureters.  Indication:  Caitlyn [name] is a 60 y.o. female with a history of a left ureteral stone.  She was able to pass Caitlyn [name] left ureteral stone over the past week.  She did obtain CT A/P yesterday that demonstrated no evidence of ureteral stone however she was found to have bilateral renal stones.  She elected to proceed with bilateral ureteroscopy and laser lithotripsy.  Description of procedure: After informed consent was obtained from the patient, the patient was identified and taken to the operating room and placed  in the supine position.  General anesthesia was administered as well as perioperative IV antibiotics.  At the beginning of the case, a time-out was performed to properly identify the patient, the surgery to be performed, and the surgical site.  Sequential compression devices were applied to the lower extremities at the beginning of the case for DVT prophylaxis.  The patient was then placed in the dorsal lithotomy supine position, prepped and draped in sterile fashion.  We then passed the 21-French rigid cystoscope through the urethra and into the bladder under vision without any difficulty, noting a normal urethra. A systematic evaluation of the bladder revealed no evidence of any suspicious bladder lesions.  Ureteral orifices were in normal orthotopic position.  Under cystoscopic and flouroscopic guidance, we cannulated the right ureteral orifice with a 5-French open-ended ureteral catheter and a gentle retrograde pyelogram was performed, revealing a normal caliber ureter without any filling defects. There was no hydronephrosis of the collecting system. A 0.038 sensor wire was then passed up to the level of the renal pelvis and secured to the drape as a safety wire. The ureteral catheter and cystoscope were removed, leaving the safety wire in place.   Under cystoscopic and flouroscopic guidance, we cannulated the left ureteral orifice with a 5-French open-ended ureteral catheter and a gentle retrograde pyelogram was performed, revealing a normal caliber ureter without any filling defects. There was no hydronephrosis of the collecting system. A 0.038 sensor wire was then passed up to the level of the renal pelvis and secured to the drape as a safety wire. The ureteral catheter and cystoscope were removed, leaving the safety wire in place.   A  semi-rigid ureteroscope was passed alongside the right wire up the distal ureter which appeared normal. A second 0.038 sensor wire was passed under direct vision and  the semirigid scope was removed.   A semi-rigid ureteroscope was passed alongside the left wire up the distal ureter which appeared normal. A second 0.038 sensor wire was passed under direct vision and the semirigid scope was removed.   I advanced a digital ureteroscope up to the level of the right UPJ over this wire under fluoroscopic guidance. The collecting system was inspected.  I noticed a few small 1-2 mm free-floating fragments and multiple Randall's plaques.  Each of these fragments and renal's plaques were then fragmented using a 200 m laser fiber.  The remaining of these fragments were smaller than the size of the fibrotic laser.  A portion of these fragments were basket extracted and removed.  I then withdrew the scope down the course of the ureter noting no trauma to the ureter and no edema.  I elected not to leave a stent.  I advanced a digital ureteroscope up to the level of the left UPJ over this wire under fluoroscopic guidance. The collecting system was inspected.  I noticed a few small 1-2 mm free-floating fragments and multiple Randall's plaques.  Each of these fragments and renal's plaques were then fragmented using a 200 m laser fiber.  The remaining of these fragments were smaller than the size of the fibrotic laser.  A portion of these fragments were basket extracted and removed.  I then withdrew the scope down the course of the ureter noting no trauma to the ureter and no edema.  I elected not to leave a stent.  Caitlyn [name] bladder was then drained.  The patient tolerated the procedure well and there was no complication. Patient was awoken from anesthesia and taken to the recovery room in stable condition. I was present and scrubbed for the entirety of the case.  Plan:  Patient will be discharged home.  She will follow-up as scheduled for postoperative renal ultrasound.   Matt R. Milina Pagett MD [facility]  Pager: [phone]

## 2021-05-26 ENCOUNTER — Encounter (HOSPITAL_BASED_OUTPATIENT_CLINIC_OR_DEPARTMENT_OTHER): Payer: Self-pay | Admitting: Urology

## 2021-06-27 IMAGING — DX DG ABDOMEN 1V
2 series · 2 of 2 positions shown · non-contrast
Comparison: KUB 09/19/2020.  CT Abdomen and Pelvis 09/16/2020.

CLINICAL DATA: 59-year-old female with left side obstructive
uropathy this month. Preoperative for lithotripsy.

EXAM:
ABDOMEN - 1 VIEW

[abdomen kub (1 of 2)]
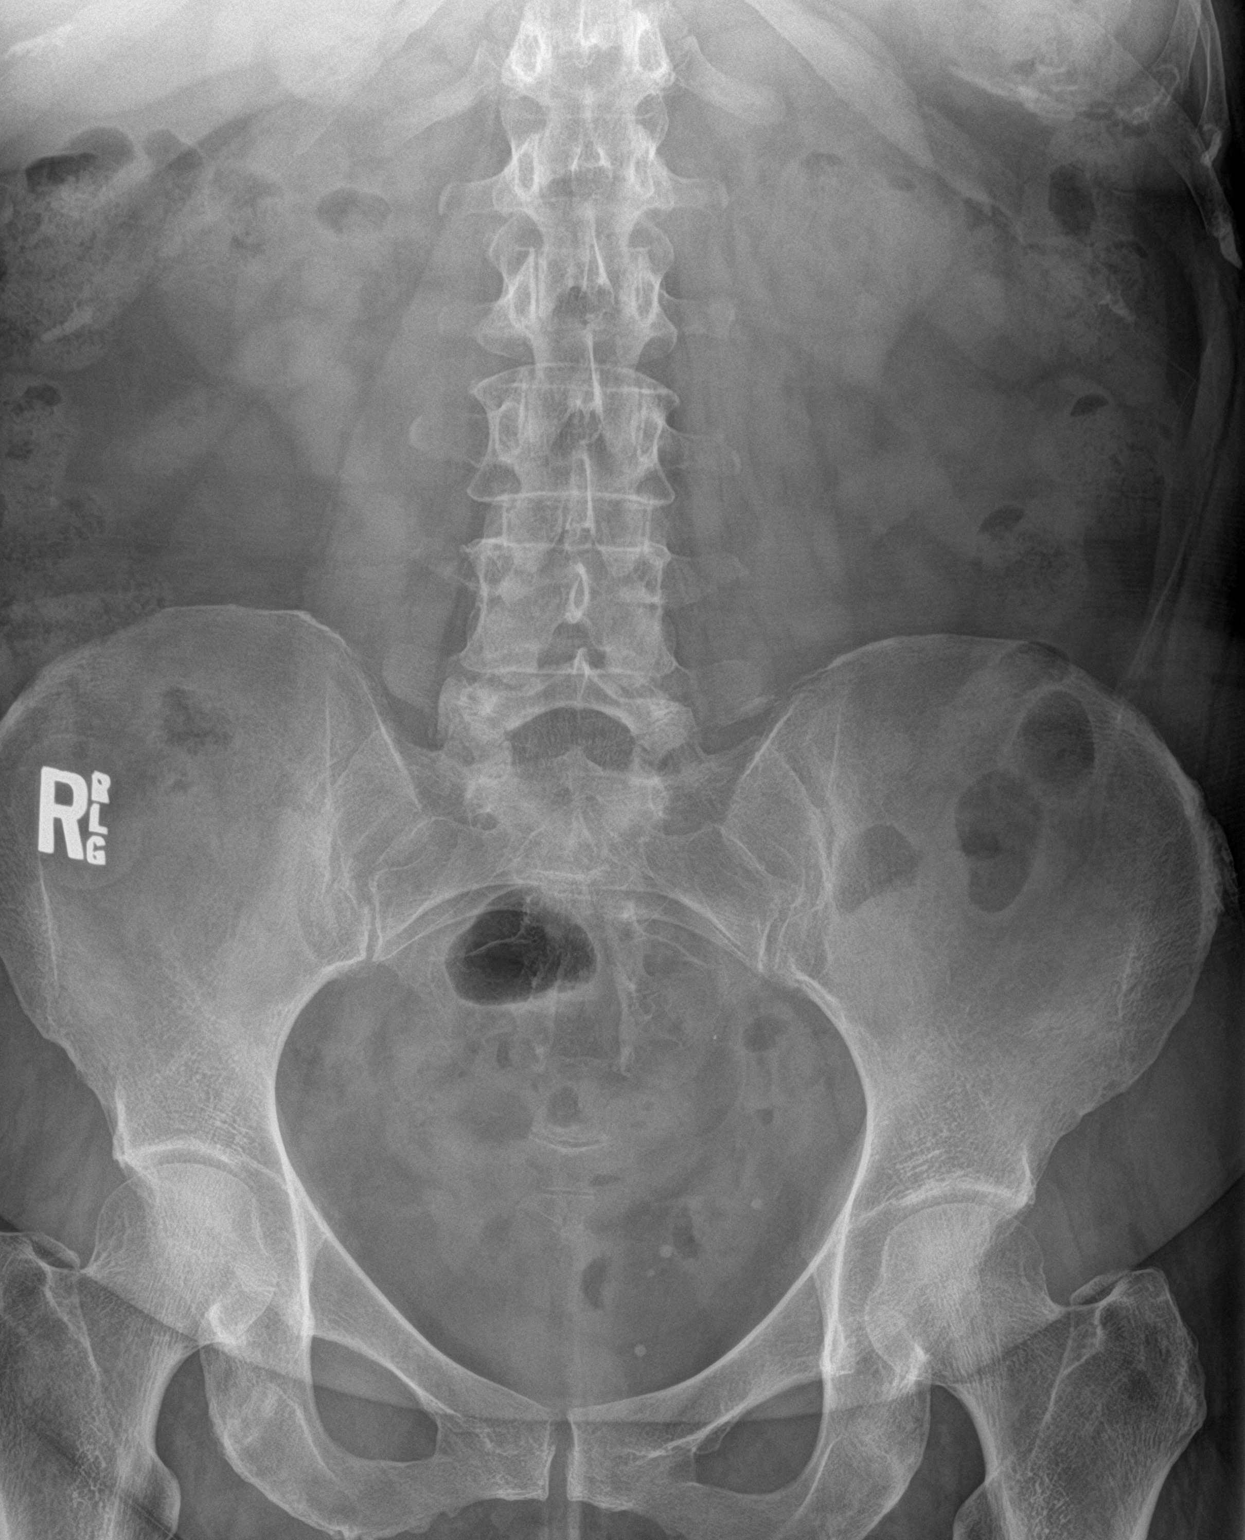

[abdomen kub (2 of 2)]
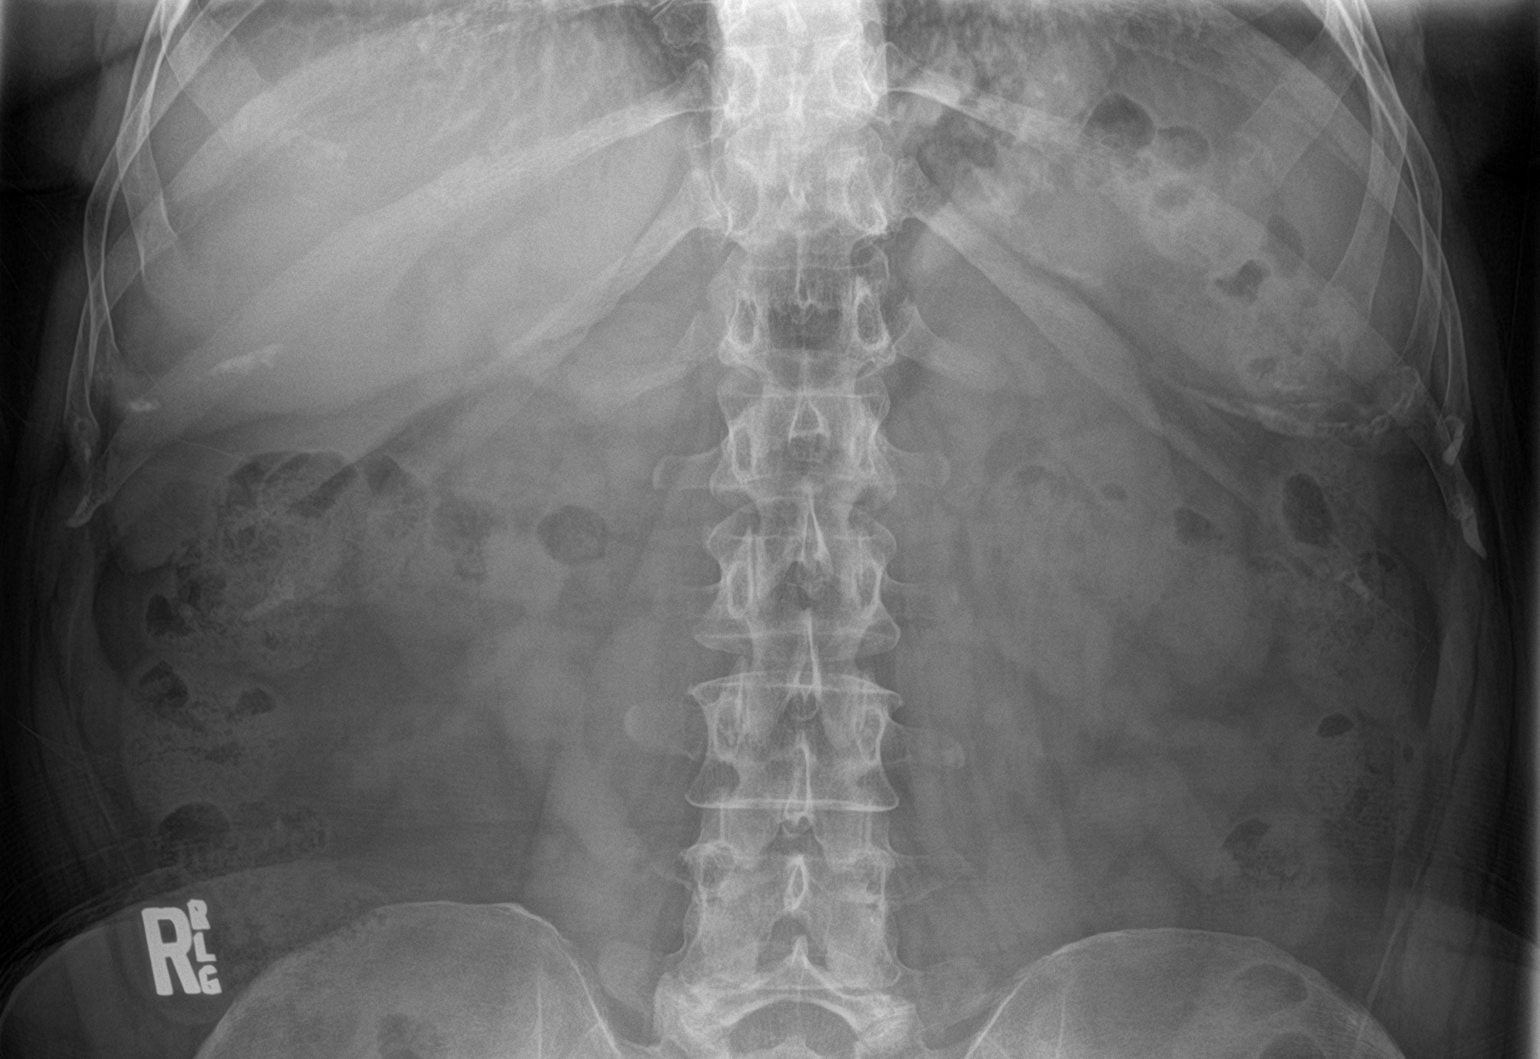

[2 of 2 positions shown; findings below may reference images not displayed]

FINDINGS: Left ureteral calculus demonstrated by CT has not been readily
apparent on KUB. Pelvic phleboliths appear stable. No definite
urinary calculus identified. Renal contours appear stable.
Nonobstructed bowel-gas pattern. Stable visualized osseous
structures.
IMPRESSION: Recent left ureteral calculus not clearly identified
radiographically. No new finding.

## 2021-10-26 IMAGING — MG MM DIGITAL SCREENING BILAT W/ TOMO AND CAD
4 series · 4 of 12 positions shown · non-contrast
Comparison: Previous exam(s).

CLINICAL DATA: Screening.

EXAM:
DIGITAL SCREENING BILATERAL MAMMOGRAM WITH TOMOSYNTHESIS AND CAD
TECHNIQUE: Bilateral screening digital craniocaudal and mediolateral oblique
mammograms were obtained. Bilateral screening digital breast
tomosynthesis was performed. The images were evaluated with
computer-aided detection.

[L MLO synth-2D]
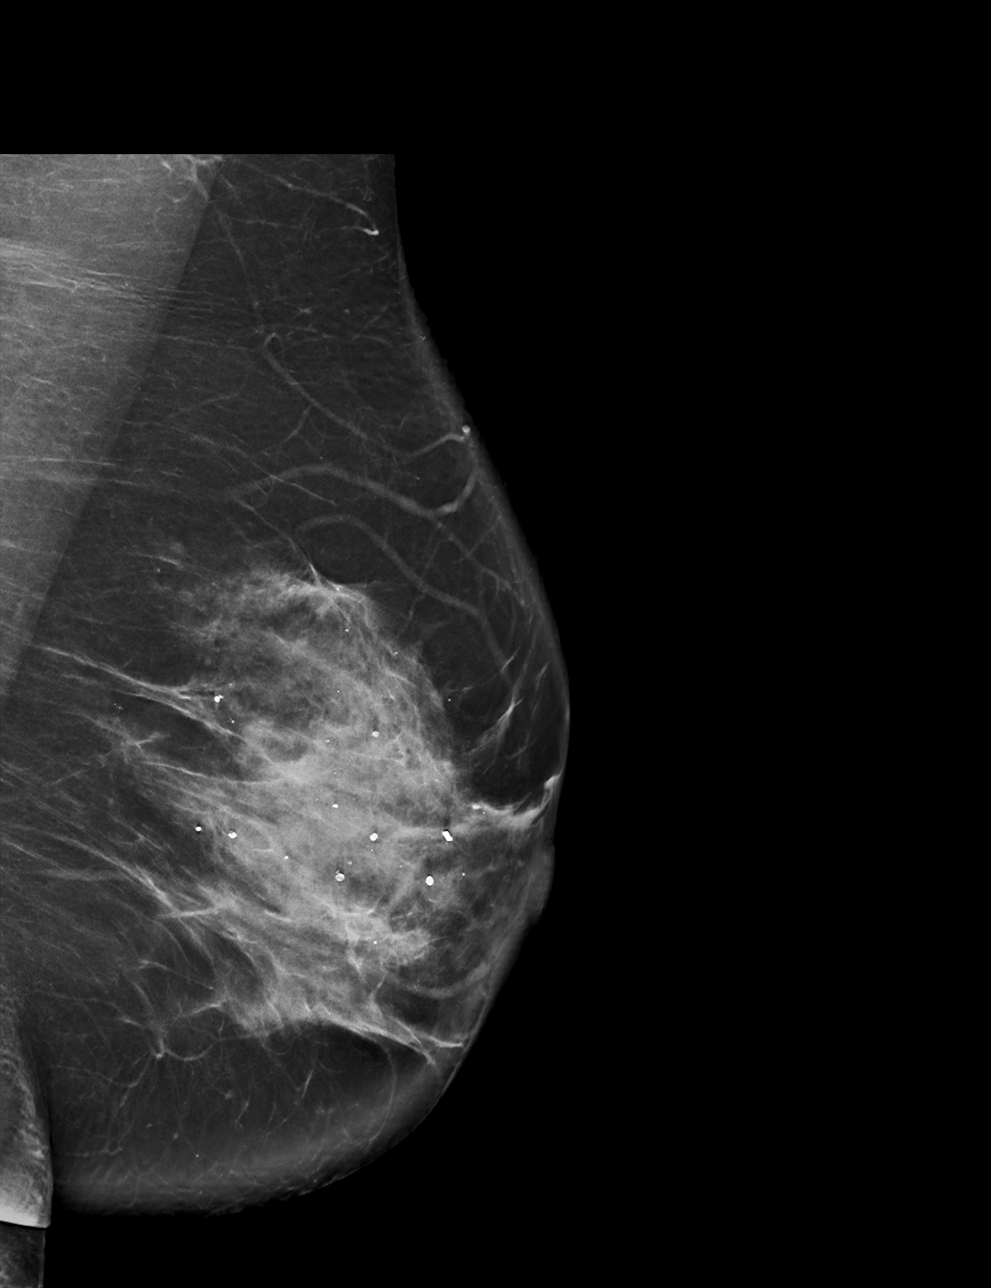

[L CC synth-2D]
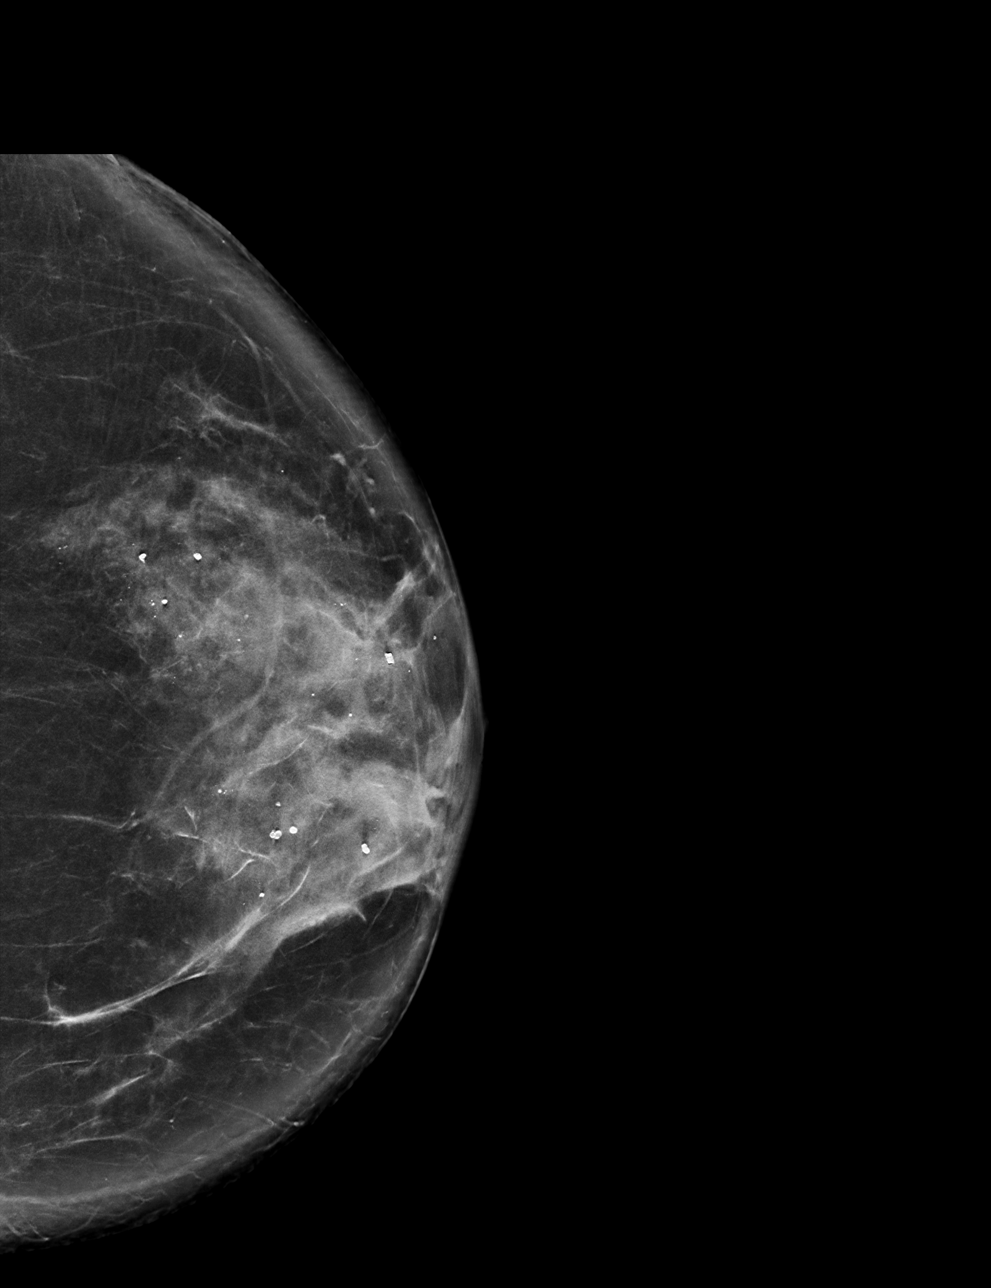

[L CC tomo · tomo slice 41/81.0]
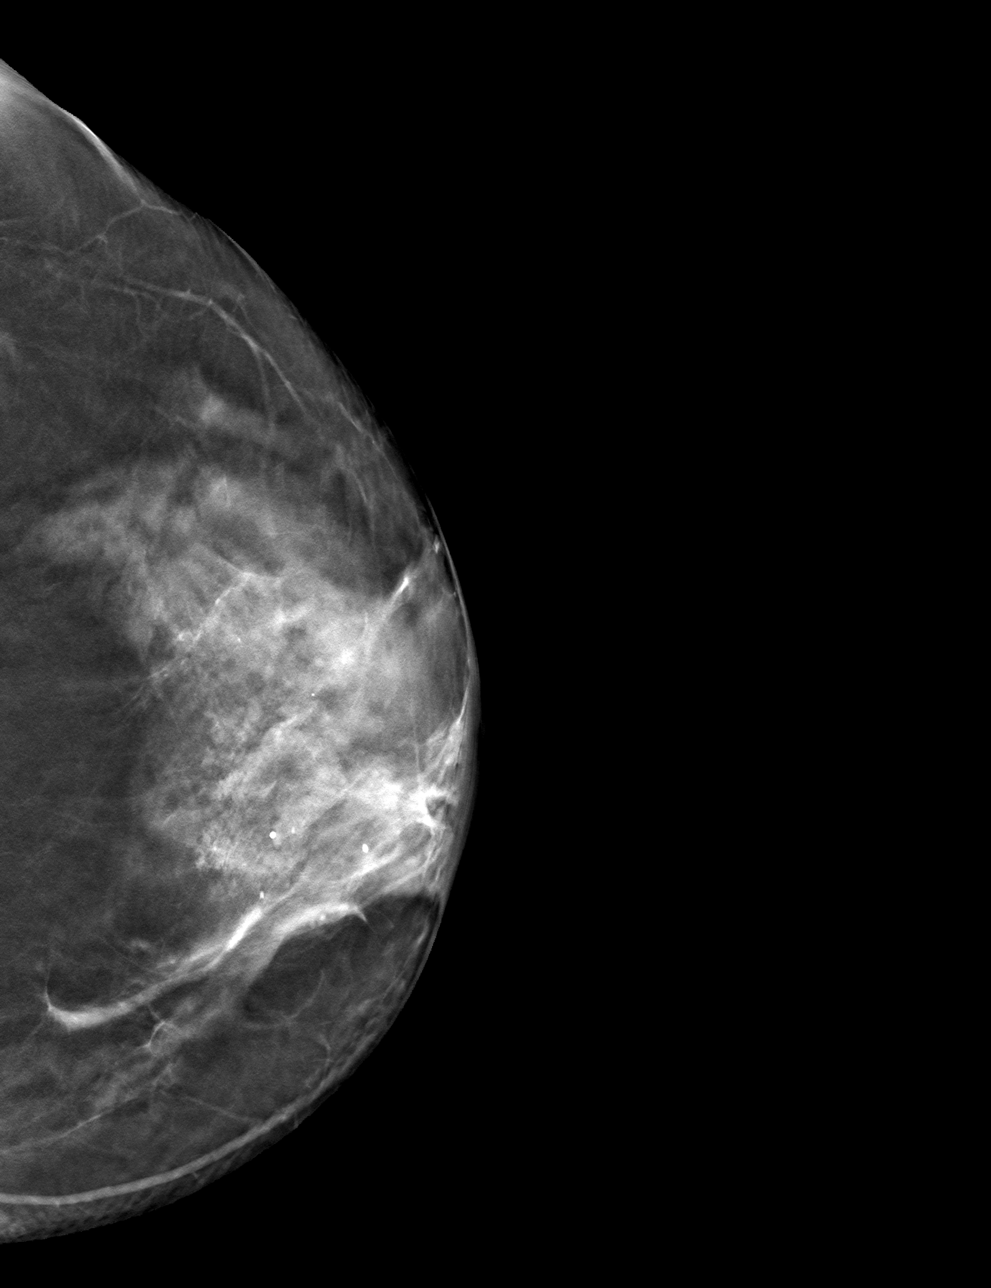

[L MLO tomo · tomo slice 45/88.0]
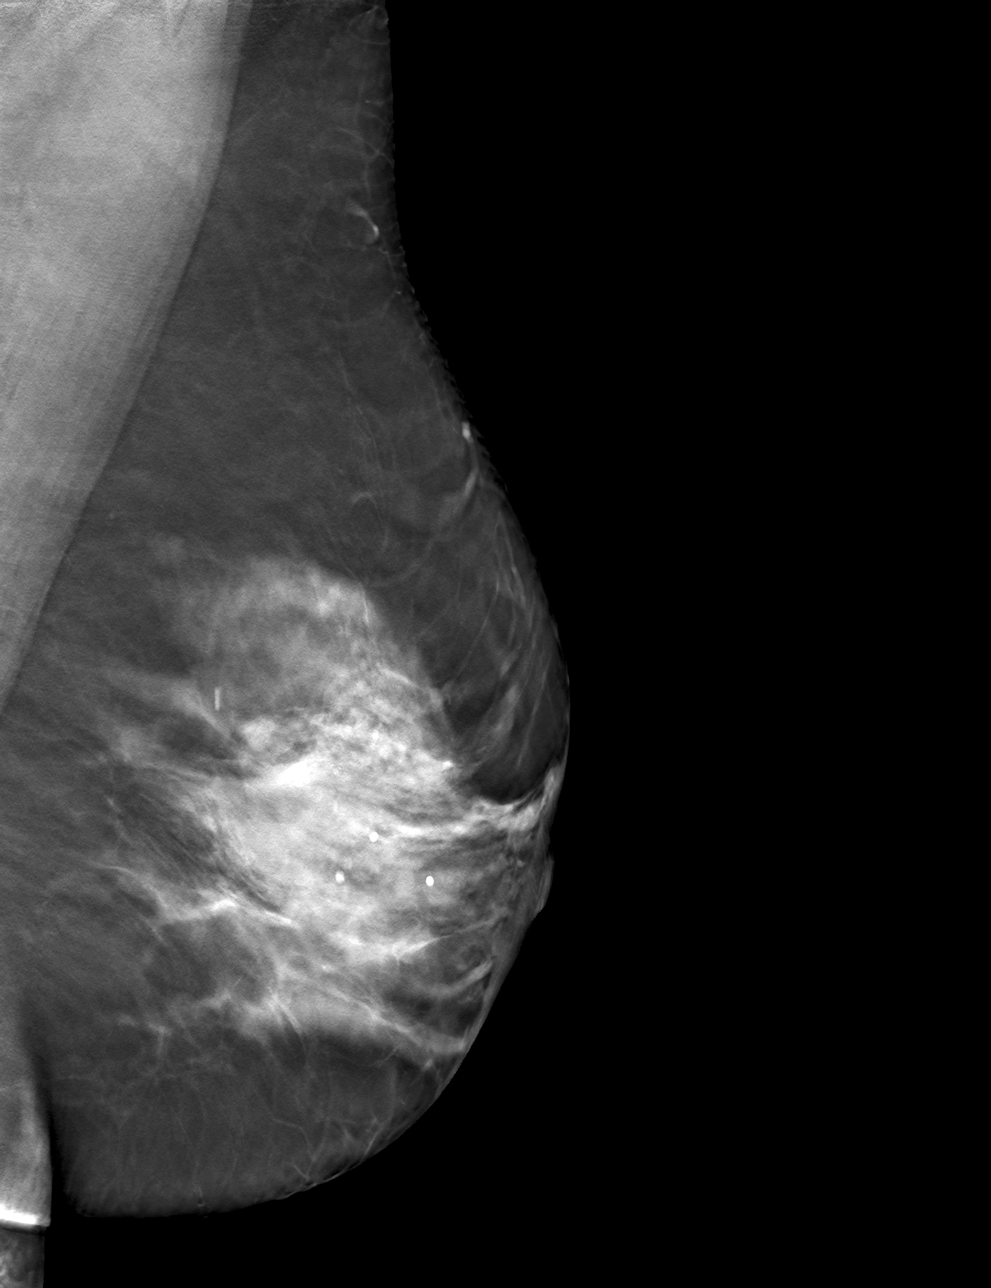

[4 of 12 positions shown; findings below may reference images not displayed]

ACR Breast Density Category c: The breast tissue is heterogeneously
dense, which may obscure small masses.
FINDINGS: There are no findings suspicious for malignancy.
IMPRESSION: No mammographic evidence of malignancy. A result letter of this
screening mammogram will be mailed directly to the patient.

RECOMMENDATION:
Screening mammogram in one year. (Code:Q3-W-BC3)

BI-RADS CATEGORY  1: Negative.

## 2021-12-31 ENCOUNTER — Other Ambulatory Visit: Payer: Self-pay | Admitting: Obstetrics and Gynecology

## 2021-12-31 DIAGNOSIS — Z1231 Encounter for screening mammogram for malignant neoplasm of breast: Secondary | ICD-10-CM

## 2022-02-17 ENCOUNTER — Ambulatory Visit
Admission: RE | Admit: 2022-02-17 | Discharge: 2022-02-17 | Disposition: A | Discharge status: Home / Self Care | Payer: BC Managed Care – PPO | Source: Ambulatory Visit | Attending: Obstetrics and Gynecology | Admitting: Obstetrics and Gynecology

## 2022-02-17 DIAGNOSIS — Z1231 Encounter for screening mammogram for malignant neoplasm of breast: Secondary | ICD-10-CM

## 2022-10-13 ENCOUNTER — Encounter: Payer: Self-pay | Admitting: Gastroenterology

## 2022-11-18 ENCOUNTER — Other Ambulatory Visit: Payer: Self-pay | Admitting: Family Medicine

## 2022-11-18 DIAGNOSIS — Z1231 Encounter for screening mammogram for malignant neoplasm of breast: Secondary | ICD-10-CM

## 2022-12-14 ENCOUNTER — Ambulatory Visit: Payer: BC Managed Care – PPO | Admitting: Gastroenterology

## 2023-02-19 ENCOUNTER — Ambulatory Visit
Admission: RE | Admit: 2023-02-19 | Discharge: 2023-02-19 | Disposition: A | Discharge status: Home / Self Care | Payer: BC Managed Care – PPO | Source: Ambulatory Visit | Attending: Family Medicine | Admitting: Family Medicine

## 2023-02-19 DIAGNOSIS — Z1231 Encounter for screening mammogram for malignant neoplasm of breast: Secondary | ICD-10-CM

## 2024-02-18 ENCOUNTER — Other Ambulatory Visit: Payer: Self-pay | Admitting: Family Medicine

## 2024-02-18 DIAGNOSIS — Z1231 Encounter for screening mammogram for malignant neoplasm of breast: Secondary | ICD-10-CM

## 2024-03-02 ENCOUNTER — Ambulatory Visit
Admission: RE | Admit: 2024-03-02 | Discharge: 2024-03-02 | Disposition: A | Discharge status: Home / Self Care | Source: Ambulatory Visit | Attending: Family Medicine | Admitting: Family Medicine

## 2024-03-02 DIAGNOSIS — Z1231 Encounter for screening mammogram for malignant neoplasm of breast: Secondary | ICD-10-CM
